# Patient Record
Sex: Male | Born: 1975 | Race: White | Hispanic: No | Marital: Married | Smoking: Never smoker
Health system: Southern US, Community
[De-identification: ages and names within clinical notes are randomized; demographics above are authoritative.]

## PROBLEM LIST (undated history)

## (undated) DIAGNOSIS — J309 Allergic rhinitis, unspecified: Secondary | ICD-10-CM

## (undated) DIAGNOSIS — Z789 Other specified health status: Secondary | ICD-10-CM

## (undated) DIAGNOSIS — T4145XA Adverse effect of unspecified anesthetic, initial encounter: Secondary | ICD-10-CM

## (undated) DIAGNOSIS — T8859XA Other complications of anesthesia, initial encounter: Secondary | ICD-10-CM

## (undated) HISTORY — DX: Allergic rhinitis, unspecified: J30.9

## (undated) HISTORY — PX: HERNIA REPAIR: SHX51

## (undated) HISTORY — PX: OTHER SURGICAL HISTORY: SHX169

---

## 2008-03-28 ENCOUNTER — Ambulatory Visit: Payer: Self-pay | Admitting: Internal Medicine

## 2008-03-28 LAB — CONVERTED CEMR LAB
ALT: 19 units/L (ref 0–53)
AST: 22 units/L (ref 0–37)
Albumin: 4.5 g/dL (ref 3.5–5.2)
BUN: 7 mg/dL (ref 6–23)
Bacteria, UA: NEGATIVE
Basophils Absolute: 0.1 10*3/uL (ref 0.0–0.1)
Basophils Relative: 0.9 % (ref 0.0–3.0)
Bilirubin Urine: NEGATIVE
CO2: 32 meq/L (ref 19–32)
Chloride: 104 meq/L (ref 96–112)
Cholesterol: 121 mg/dL (ref 0–200)
Creatinine, Ser: 0.9 mg/dL (ref 0.4–1.5)
Glucose, Bld: 90 mg/dL (ref 70–99)
HCT: 44.1 % (ref 39.0–52.0)
Hemoglobin: 15.4 g/dL (ref 13.0–17.0)
Ketones, ur: NEGATIVE mg/dL
LDL Cholesterol: 64 mg/dL (ref 0–99)
Leukocytes, UA: NEGATIVE
Lymphocytes Relative: 34.1 % (ref 12.0–46.0)
MCHC: 34.8 g/dL (ref 30.0–36.0)
Monocytes Absolute: 0.5 10*3/uL (ref 0.1–1.0)
Mucus, UA: NEGATIVE
Neutro Abs: 2.8 10*3/uL (ref 1.4–7.7)
RBC: 4.93 M/uL (ref 4.22–5.81)
Squamous Epithelial / LPF: NEGATIVE /lpf
TSH: 2.09 microintl units/mL (ref 0.35–5.50)
Total Protein, Urine: NEGATIVE mg/dL
Total Protein: 7.4 g/dL (ref 6.0–8.3)
pH: 6 (ref 5.0–8.0)

## 2008-03-29 ENCOUNTER — Encounter: Payer: Self-pay | Admitting: Internal Medicine

## 2008-12-05 ENCOUNTER — Ambulatory Visit: Payer: Self-pay | Admitting: Internal Medicine

## 2008-12-05 DIAGNOSIS — J3089 Other allergic rhinitis: Secondary | ICD-10-CM

## 2008-12-05 LAB — CONVERTED CEMR LAB
Basophils Absolute: 0 10*3/uL (ref 0.0–0.1)
HCT: 43.1 % (ref 39.0–52.0)
Lymphocytes Relative: 31.9 % (ref 12.0–46.0)
Lymphs Abs: 1.6 10*3/uL (ref 0.7–4.0)
Monocytes Relative: 11.4 % (ref 3.0–12.0)
Platelets: 146 10*3/uL — ABNORMAL LOW (ref 150.0–400.0)
RDW: 12.1 % (ref 11.5–14.6)

## 2009-04-25 ENCOUNTER — Ambulatory Visit: Payer: Self-pay | Admitting: Internal Medicine

## 2009-06-01 ENCOUNTER — Ambulatory Visit: Payer: Self-pay | Admitting: Internal Medicine

## 2009-06-01 DIAGNOSIS — J309 Allergic rhinitis, unspecified: Secondary | ICD-10-CM | POA: Insufficient documentation

## 2009-06-01 HISTORY — DX: Allergic rhinitis, unspecified: J30.9

## 2009-09-28 ENCOUNTER — Ambulatory Visit: Payer: Self-pay | Admitting: Internal Medicine

## 2010-05-07 ENCOUNTER — Telehealth: Payer: Self-pay | Admitting: Internal Medicine

## 2010-06-04 NOTE — Assessment & Plan Note (Signed)
Summary: 4 MO ROV /NWS #     Vital Signs:  Patient profile:   35 year old male Height:      71 inches Weight:      157 pounds BMI:     21.98 O2 Sat:      99 % on Room air Temp:     98.0 degrees F oral Pulse rate:   65 / minute Pulse rhythm:   regular Resp:     16 per minute BP sitting:   120 / 70  (left arm) Cuff size:   large  Vitals Entered By: Rock Nephew CMA (Sep 28, 2009 3:48 PM)  O2 Flow:  Room air  Primary Care Provider:  Etta Grandchild MD   History of Present Illness:  Follow-Up Visit      This is a 35 year old man who presents for Follow-up visit.  The patient denies chest pain, palpitations, dizziness, syncope, and SOB.  Since the last visit the patient notes no new problems or concerns.  The patient reports taking meds as prescribed.  When questioned about possible medication side effects, the patient notes none.    Preventive Screening-Counseling & Management  Alcohol-Tobacco     Alcohol drinks/day: 0     Smoking Status: never     Passive Smoke Exposure: no  Hep-HIV-STD-Contraception     Hepatitis Risk: no risk noted     HIV Risk: no     STD Risk: no risk noted      Sexual History:  currently monogamous.        Drug Use:  no.        Blood Transfusions:  no.    Clinical Review Panels:  Lipid Management   Cholesterol:  121 (03/28/2008)   LDL (bad choesterol):  64 (03/28/2008)   HDL (good cholesterol):  50.1 (03/28/2008)  Diabetes Management   Creatinine:  0.9 (03/28/2008)   Last Flu Vaccine:  Fluvax 3+ (03/28/2008)  CBC   WBC:  5.1 (12/05/2008)   RBC:  4.86 (12/05/2008)   Hgb:  14.6 (12/05/2008)   Hct:  43.1 (12/05/2008)   Platelets:  146.0 (12/05/2008)   MCV  88.8 (12/05/2008)   MCHC  33.9 (12/05/2008)   RDW  12.1 (12/05/2008)   PMN:  49.9 (12/05/2008)   Lymphs:  31.9 (12/05/2008)   Monos:  11.4 (12/05/2008)   Eosinophils:  6.3 (12/05/2008)   Basophil:  0.5 (12/05/2008)  Complete Metabolic Panel   Glucose:  90 (03/28/2008)  Sodium:  141 (03/28/2008)   Potassium:  3.9 (03/28/2008)   Chloride:  104 (03/28/2008)   CO2:  32 (03/28/2008)   BUN:  7 (03/28/2008)   Creatinine:  0.9 (03/28/2008)   Albumin:  4.5 (03/28/2008)   Total Protein:  7.4 (03/28/2008)   Calcium:  9.6 (03/28/2008)   Total Bili:  0.7 (03/28/2008)   Alk Phos:  62 (03/28/2008)   SGPT (ALT):  19 (03/28/2008)   SGOT (AST):  22 (03/28/2008)   Medications Prior to Update: 1)  Nasacort Aq 55 Mcg/act Aers (Triamcinolone Acetonide(Nasal)) .... 2 Puffs Each Nostril Qd 2)  Singulair 10 Mg Tabs (Montelukast Sodium) .... One By Mouth Once Daily  Current Medications (verified): 1)  Nasacort Aq 55 Mcg/act Aers (Triamcinolone Acetonide(Nasal)) .... 2 Puffs Each Nostril Qd 2)  Singulair 10 Mg Tabs (Montelukast Sodium) .... One By Mouth Once Daily 3)  Allegra 180 Mg Tabs (Fexofenadine Hcl) .... One By Mouth Once Daily As Needed For Nasal Allergies  Allergies (verified): No Known Drug  Allergies  Past History:  Past Medical History: Reviewed history from 04/25/2009 and no changes required. Allergic rhinitis  Past Surgical History: Reviewed history from 03/28/2008 and no changes required. Denies surgical history  Family History: Reviewed history from 03/28/2008 and no changes required. Family History of Arthritis Family History High cholesterol Family History Hypertension  Social History: Reviewed history from 04/25/2009 and no changes required. 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   ENT:  Complains of postnasal drainage; denies decreased hearing, difficulty swallowing, ear discharge, earache, nasal congestion,  nosebleeds, ringing in ears, sinus pressure, and sore throat.  Physical Exam  General:  alert, well-developed, well-nourished, well-hydrated, appropriate dress, normal appearance, healthy-appearing, and cooperative to examination.   Ears:  R ear normal and L ear normal.   Nose:  mucosal pallor and mild swelling.   Mouth:  good dentition, no exudates, posterior lymphoid hypertrophy, and postnasal drip.   Neck:  supple, full ROM, no masses, no carotid bruits, no cervical lymphadenopathy, and no neck tenderness.   Lungs:  Normal respiratory effort, chest expands symmetrically. Lungs are clear to auscultation, no crackles or wheezes. Heart:  Normal rate and regular rhythm. S1 and S2 normal without gallop, murmur, click, rub or other extra sounds. Abdomen:  Bowel sounds positive,abdomen soft and non-tender without masses, organomegaly or hernias noted. Msk:  normal ROM, no joint tenderness, no joint swelling, no joint warmth, no redness over joints, no joint deformities, and no joint instability.   Pulses:  R and L carotid,radial,femoral,dorsalis pedis and posterior tibial pulses are full and equal bilaterally Extremities:  No clubbing, cyanosis, edema, or deformity noted with normal full range of motion of all joints.   Neurologic:  No cranial nerve deficits noted. Station and gait are normal. Plantar reflexes are down-going bilaterally. DTRs are symmetrical throughout. Sensory, motor and coordinative functions appear intact. Skin:  Intact without suspicious lesions or rashes Cervical Nodes:  no anterior cervical adenopathy and no posterior cervical adenopathy.   Axillary Nodes:  no R axillary adenopathy and no L axillary adenopathy.   Psych:  Cognition and judgment appear intact. Alert and cooperative with normal attention span and concentration. No apparent delusions, illusions, hallucinations   Impression & Recommendations:  Problem # 1:  ALLERGIC RHINITIS CAUSE UNSPECIFIED  (ICD-477.9) Assessment Unchanged  His updated medication list for this problem includes:    Nasacort Aq 55 Mcg/act Aers (Triamcinolone acetonide(nasal)) .Marland Kitchen... 2 puffs each nostril qd    Allegra 180 Mg Tabs (Fexofenadine hcl) ..... One by mouth once daily as needed for nasal allergies  Discussed use of allergy medications and environmental measures.   Complete Medication List: 1)  Nasacort Aq 55 Mcg/act Aers (Triamcinolone acetonide(nasal)) .... 2 puffs each nostril qd 2)  Singulair 10 Mg Tabs (Montelukast sodium) .... One by mouth once daily 3)  Allegra 180 Mg Tabs (Fexofenadine hcl) .... One by mouth once daily as needed for nasal allergies  Patient Instructions: 1)  Please schedule a follow-up appointment as needed. Prescriptions: ALLEGRA 180 MG TABS (FEXOFENADINE HCL) One by mouth once daily as needed for nasal allergies  #20 x 0   Entered and Authorized by:   Etta Grandchild MD   Signed by:   Etta Grandchild MD on  09/28/2009   Method used:   Telephoned to ...         RxID:   [id number]

## 2010-06-04 NOTE — Assessment & Plan Note (Signed)
Summary: 1 MO ROV /NWS   Vital Signs:  Patient profile:   35 year old male Height:      71 inches Weight:      158 pounds BMI:     22.12 O2 Sat:      99 % on Room air Temp:     98.0 degrees F oral Pulse rate:   70 / minute Pulse rhythm:   regular Resp:     16 per minute BP sitting:   112 / 72  (left arm) Cuff size:   large  Vitals Entered By: Rock Nephew CMA (June 01, 2009 3:43 PM)  O2 Flow:  Room air  Primary Care Provider:  Etta Grandchild MD   History of Present Illness: He returns for a f/up on nasal allergies. He thinks that the Nasacort helps but he did not get much benefit from Clarinex.  Preventive Screening-Counseling & Management  Alcohol-Tobacco     Alcohol drinks/day: 0     Smoking Status: never     Passive Smoke Exposure: no  Hep-HIV-STD-Contraception     HIV Risk: no  Medications Prior to Update: 1)  Nasacort Aq 55 Mcg/act Aers (Triamcinolone Acetonide(Nasal)) .... 2 Puffs Each Nostril Qd 2)  Clarinex 5 Mg Tabs (Desloratadine) .... Once Daily For Allergies  Current Medications (verified): 1)  Nasacort Aq 55 Mcg/act Aers (Triamcinolone Acetonide(Nasal)) .... 2 Puffs Each Nostril Qd 2)  Clarinex 5 Mg Tabs (Desloratadine) .... Once Daily For Allergies  Allergies (verified): No Known Drug Allergies  Past History:  Past Medical History: Reviewed history from 04/25/2009 and no changes required. Allergic rhinitis  Past Surgical History: Reviewed history from 03/28/2008 and no changes required. Denies surgical history  Family History: Reviewed history from 03/28/2008 and no changes required. Family History of Arthritis Family History High cholesterol Family History Hypertension  Social History: Reviewed history from 04/25/2009 and no changes required. 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   ENT:  Complains of nasal congestion and postnasal drainage; denies decreased hearing, difficulty swallowing, ear discharge, earache, hoarseness, ringing in ears, sinus pressure, and sore throat.  Physical Exam  General:  alert, well-developed, well-nourished, well-hydrated, appropriate dress, normal appearance, healthy-appearing, and cooperative to examination.   Nose:  mucosal pallor and mild swelling.   Mouth:  good dentition, no exudates, posterior lymphoid hypertrophy, and postnasal drip.   Neck:  supple, full ROM, no masses, no carotid bruits, no cervical lymphadenopathy, and no neck tenderness.   Lungs:  Normal respiratory effort, chest expands symmetrically. Lungs are clear to auscultation, no crackles or wheezes. Heart:  Normal rate and regular rhythm. S1 and S2 normal without gallop, murmur, click, rub or other extra sounds. Abdomen:  Bowel sounds positive,abdomen soft and non-tender without masses, organomegaly or hernias noted. Skin:  Intact without suspicious lesions or rashes Cervical Nodes:  no anterior cervical adenopathy and no posterior cervical adenopathy.   Axillary Nodes:  no R axillary adenopathy and no L axillary adenopathy.   Inguinal Nodes:  no R inguinal adenopathy and no L inguinal adenopathy.   Psych:  Cognition and judgment appear intact. Alert and cooperative with normal attention span and concentration. No apparent delusions, illusions, hallucinations   Impression & Recommendations:  Problem # 1:  ALLERGIC RHINITIS CAUSE UNSPECIFIED (ICD-477.9) Assessment Unchanged try singulair The following medications were removed from  the medication list:    Clarinex 5 Mg Tabs (Desloratadine) ..... Once daily for allergies His updated medication list for this problem includes:    Nasacort Aq 55 Mcg/act Aers (Triamcinolone acetonide(nasal)) .Marland Kitchen... 2 puffs each nostril qd  Complete Medication List: 1)   Nasacort Aq 55 Mcg/act Aers (Triamcinolone acetonide(nasal)) .... 2 puffs each nostril qd 2)  Singulair 10 Mg Tabs (Montelukast sodium) .... One by mouth once daily  Patient Instructions: 1)  Please schedule a follow-up appointment in 4 months. Prescriptions: SINGULAIR 10 MG TABS (MONTELUKAST SODIUM) One by mouth once daily  #112 x 0   Entered and Authorized by:   Etta Grandchild MD   Signed by:   Etta Grandchild MD on 06/01/2009   Method used:   Samples Given   RxID:   [id number]

## 2010-06-06 NOTE — Progress Notes (Signed)
  Phone Note Refill Request Message from:  Fax from Pharmacy on May 07, 2010 9:15 AM  Refills Requested: Medication #1:  NASACORT AQ 55 MCG/ACT AERS 2 puffs each nostril qd   Last Refilled: 02/28/2010 Initial call taken by: Rock Nephew CMA,  May 07, 2010 9:15 AM    Prescriptions: NASACORT AQ 55 MCG/ACT AERS (TRIAMCINOLONE ACETONIDE(NASAL)) 2 puffs each nostril qd  #1 inhaler x 6   Entered by:   Rock Nephew CMA   Authorized by:   Etta Grandchild MD   Signed by:   Rock Nephew CMA on 05/07/2010   Method used:   Faxed to ...       [facility] (retail)       [phone]       [place], [place]  [address]       Ph: [phone]       Fax: [phone]   RxID:   [id number]

## 2010-11-07 ENCOUNTER — Other Ambulatory Visit (INDEPENDENT_AMBULATORY_CARE_PROVIDER_SITE_OTHER): Payer: Managed Care, Other (non HMO)

## 2010-11-07 ENCOUNTER — Ambulatory Visit (INDEPENDENT_AMBULATORY_CARE_PROVIDER_SITE_OTHER): Payer: Managed Care, Other (non HMO) | Admitting: Internal Medicine

## 2010-11-07 ENCOUNTER — Encounter: Payer: Self-pay | Admitting: Internal Medicine

## 2010-11-07 VITALS — BP 110/68 | HR 103 | Temp 100.5°F | Ht 71.0 in | Wt 152.4 lb

## 2010-11-07 DIAGNOSIS — R300 Dysuria: Secondary | ICD-10-CM

## 2010-11-07 LAB — URINALYSIS, ROUTINE W REFLEX MICROSCOPIC
Bilirubin Urine: NEGATIVE
Nitrite: POSITIVE
pH: 6 (ref 5.0–8.0)

## 2010-11-07 MED ORDER — CEPHALEXIN 500 MG PO CAPS: 500.0000 mg | ORAL_CAPSULE | Freq: Four times a day (QID) | ORAL | Status: DC

## 2010-11-07 NOTE — Assessment & Plan Note (Signed)
prob UTI by hx, but without specific risk factors;  No prior hx of such;  Mild to mod, for antibx course,  to f/u any worsening symptoms or concerns, for urine studies today

## 2010-11-07 NOTE — Patient Instructions (Signed)
Take all new medications as prescribed Please go to LAB in the Basement for the blood and/or urine tests to be done today Please call the phone number [phone] (the [employer]Tree System) for results of testing in 2-3 days;  When calling, simply dial the number, and when prompted enter the MRN number above (the Medical Record Number) and the # key, then the message should start.

## 2010-11-07 NOTE — Progress Notes (Signed)
  Subjective:    Patient ID: Derek [name], male    DOB: [birth year], 35 y.o.   MRN: [id number]  HPI  Here with 2 days onset fever up to 101, and dysuria, but without  frequency, urgency,or hematuria, abd pain, n/v, chills, back or flank pain.  No prior hx of UTI, no other symptoms to suggest other source such as HA, ST, rash, CP, SOB.  Incidentally wife jsut recently tx in ER for UTI as well.  Pt denies chest pain, increased sob or doe, wheezing, orthopnea, PND, increased LE swelling, palpitations, dizziness or syncope.  Pt denies new neurological symptoms such as new headache, or facial or extremity weakness or numbness   Pt denies polydipsia, polyuria.  No hx of DM , malignancy, other immunesuppressing illness or specific GU problem such as ureteral stricture.  Denies penile d/c , or HIV risk. Past Medical History  Diagnosis Date  . ALLERGIC RHINITIS CAUSE UNSPECIFIED 06/01/2009   No past surgical history on file.  reports that he has never smoked. He does not have any smokeless tobacco history on file. He reports that he does not drink alcohol or use illicit drugs. family history includes Arthritis in his other; Hyperlipidemia in his other; and Hypertension in his other. No Known Allergies No current outpatient prescriptions on file prior to visit.   Review of Systems Review of Systems  Constitutional: Negative for diaphoresis and unexpected weight change.  HENT: Negative for drooling and tinnitus.   Eyes: Negative for photophobia and visual disturbance.  Respiratory: Negative for choking and stridor.   Gastrointestinal: Negative for vomiting and blood in stool.  Genitourinary: Negative for hematuria and decreased urine volume.     Objective:   Physical Exam BP 110/68  Pulse 103  Temp(Src) 100.5 F (38.1 C) (Oral)  Ht 5\' 11"  (1.803 m)  Wt 152 lb 6 oz (69.117 kg)  BMI 21.25 kg/m2  SpO2 99% Physical Exam  VS noted, fatigued but nontoxic Constitutional: Pt appears well-developed and  well-nourished.  HENT: Head: Normocephalic.  Right Ear: External ear normal.  Left Ear: External ear normal. Bilat tm's clear, canals normal, pharynx bening Eyes: Conjunctivae and EOM are normal. Pupils are equal, round, and reactive to light.  Neck: Normal range of motion. Neck supple.  Cardiovascular: Normal rate and regular rhythm.   Pulmonary/Chest: Effort normal and breath sounds normal.  Abd:  Soft, NT, non-distended, + BS, no splenomegaly, no flank tender Neurological: Pt is alert. No cranial nerve deficit.  Skin: Skin is warm. No erythema.  Psychiatric: Pt behavior is normal. Thought content normal.         Assessment & Plan:

## 2010-11-10 ENCOUNTER — Other Ambulatory Visit: Payer: Self-pay | Admitting: Internal Medicine

## 2010-11-10 MED ORDER — CIPROFLOXACIN HCL 500 MG PO TABS: 500.0000 mg | ORAL_TABLET | Freq: Two times a day (BID) | ORAL | Status: AC

## 2010-11-22 ENCOUNTER — Telehealth: Payer: Self-pay | Admitting: *Deleted

## 2010-11-22 NOTE — Telephone Encounter (Signed)
Pt left vm at 4:32pm stating has completed recent abx for E-Coli and since completing it he has been experiencing burning during urination. He is asking whether he should start a diff rx. Please advise

## 2010-11-23 MED ORDER — CIPROFLOXACIN HCL 500 MG PO TABS: 500.0000 mg | ORAL_TABLET | Freq: Two times a day (BID) | ORAL | Status: AC

## 2010-11-25 ENCOUNTER — Telehealth: Payer: Self-pay

## 2010-11-25 NOTE — Telephone Encounter (Signed)
[facility] Triage Call Report Triage Record Num: [id number] Operator: Amy Head Patient Name: Derek [name] Call Date & Time: 11/23/2010 9:28:32AM Patient Phone: [phone] PCP: Patient Gender: Male PCP Fax : Patient DOB: 1975/05/07 Practice Name: [facility] Reason for Call: Pt/[place] calling for Urinary frequency and burning. Upon callback, pt states that he has just spoken with Dr. Yetta Barre and does not need any triage, however does need an appt for next week per Dr. Yetta Barre instructions. Spoke with Rosa with [facility] and states that pt needs to call office on Monday, 11/25/10 for appt. Pt agreed. Protocol(s) Used: Office Note Recommended Outcome per Protocol: Information Noted and Sent to Office Reason for Outcome: Caller information to office Care Advice: ~ 11/23/2010 9:33:18AM Page 1 of 1 [email or web address]

## 2010-11-27 ENCOUNTER — Encounter: Payer: Self-pay | Admitting: Internal Medicine

## 2010-11-27 ENCOUNTER — Ambulatory Visit (INDEPENDENT_AMBULATORY_CARE_PROVIDER_SITE_OTHER): Payer: Managed Care, Other (non HMO) | Admitting: Internal Medicine

## 2010-11-27 VITALS — BP 112/64 | HR 63 | Temp 97.3°F | Resp 16 | Wt 152.0 lb

## 2010-11-27 DIAGNOSIS — N410 Acute prostatitis: Secondary | ICD-10-CM

## 2010-11-27 NOTE — Assessment & Plan Note (Signed)
With the size and consistency of his prostate exam and the + urine culture I think he will need at least 4 weeks of treatment with cipro, he was given pt ed material about prostatitis

## 2010-11-27 NOTE — Patient Instructions (Signed)
Prostatitis Prostatitis is an inflammation (the body's way of reacting to injury and/or infection) of the prostate gland. The prostate gland is a male organ. The gland is about the size and shape of a walnut. The prostate is located just below the bladder. It produces semen, which is a fluid that helps nourish and transport sperm. Prostatitis is the most common urinary tract problem in men younger than age 34. There are 4 categories of prostatitis:  I - Acute bacterial prostatitis.   II - Chronic bacterial prostatitis.   III - Chronic prostatitis and chronic pelvic pain syndrome (CPPS).   Inflammatory.   Non inflammatory.   IV - Asymptomatic inflammatory prostatitis.  Acute and chronic bacterial prostatitis are problems with bacterial infections of the prostate. "Acute" infection is usually a one-time problem. "Chronic" bacterial prostatitis is a condition with recurrent infection. It is usually caused by the same germ (bacteria). CPPS has symptoms similar to prostate infection. However, no infection is actually found. This condition can cause problems of ongoing pain. Currently, it cannot be cured. Treatments are available and aimed at symptom control.  Asymptomatic inflammatory prostatitis has no symptoms. It is a condition where infection-fighting cells are found by chance in the urine. The diagnosis is made most often during an exam for other conditions. Other conditions could be infertility or a high level of PSA (prostate-specific antigen) in the blood. SYMPTOMS Symptoms can vary depending upon the type of prostatitis that exists. There can also be overlap in symptoms. This can make diagnosis difficult. Symptoms: For Acute bacterial prostatitis  Painful urination.  Fever and/or chills.   Muscle and/or joint pains.   Low back pain.   Low abdominal pain.   Inability to empty bladder completely.   Sudden urges to urinate.  Frequent urination during the day.   Difficulty  starting urine stream.   Need to urinate several times at night (nocturia).   Weak urine stream.   Urethral (tube that carries urine from the bladder out of the body) discharge and dribbling after urination.   For Chronic bacterial prostatitis  Rectal pain.   Pain in the testicles, penis, or tip of the penis.   Pain in the space between the anus and scrotum (perineum).   Low back pain.   Low abdominal pain.   Problems with sexual function.   Painful ejaculation.   Bloody semen.   Inability to empty bladder completely.   Painful urination.   Sudden urges to urinate.   Frequent urination during the day.   Difficulty starting urine stream.   Need to urinate several times at night (nocturia).   Weak urine stream.   Dribbling after urination.   Urethral discharge.   For Chronic prostatitis and chronic pelvic pain syndrome (CPPS) Symptoms are the same as those for chronic bacterial prostatitis. Problems with sexual function are often the reason for seeking care. This important problem should be discussed with your caregiver. For Asymptomatic inflammatory prostatitis As noted above, there are no symptoms with this condition. DIAGNOSIS  Your caregiver may perform a rectal exam. This exam is to determine if the prostate is swollen and tender.   Sometimes blood work is performed. This is done to see if your white blood cell count is elevated. The Prostate Specific Antigen (PSA) is also measured. PSA is a blood test that can help detect early prostate cancer.   A urinalysis is done to find out what type of infection is present if this is a suspected cause. An additional   urinalysis may be done after a digital rectal exam. This is to see if white blood cells are pushed out of the prostate and into the urine. A low-grade infection of the prostate may not be found on the first urinalysis.  In more difficult cases, your caregiver may advise other tests. Tests could  include:  Urodynamics -- Tests the function of the bladder and the organs involved in triggering and controlling normal urination.   Urine flow rate.   Cystoscopy -- In this procedure, a thin, telescope-like tube with a light and tiny camera attached (cystoscope) is inserted into the bladder through the urethra. This allows the caregiver to see the inside of the urethra and bladder.   Electromyography -- This procedure tests how the muscles and nerves of the bladder work. It is focused on the muscles that control the anus and pelvic floor. These are the muscles between the anus and scrotum.  In people who show no signs of infection, certain uncommon infections might be causing constant or recurrent symptoms. These uncommon infections are difficult to detect. More work in medicine may help find solutions to these problems. TREATMENT Antibiotics are used to treat infections caused by germs. If the infection is not treated and becomes long lasting (chronic), it may become a lower grade infection with minor, continual problems. Without treatment, the prostate may develop a boil or furuncle (abscess). This may require surgical treatment. For those with chronic prostatitis and CPPS, it is important to work closely with your primary caregiver and urologist. For some, the medicines that are used to treat a non-cancerous, enlarged prostate (benign prostatic hypertrophy) may be helpful. Referrals to specialists other than urologists may be necessary. In rare cases when all treatments have been inadequate for pain control, an operation to remove the prostate may be recommended. This is very rare and before this is considered thorough discussion with your urologist is highly recommended.  In cases of secondary to chronic non-bacterial prostatitis, a good relationship with your urologist or primary caregiver is essential because it is often a recurrent prolonged condition that requires a good understanding of the  causes and a commitment to therapy aimed at controlling your symptoms. HOME CARE INSTRUCTIONS  Hot sitz baths for 20 minutes, 4 times per day, may help relieve pain.   Non-prescription pain killers may be used as your caregiver recommends if you have no allergies to them. Some illnesses or conditions prevent use of non-prescription drugs. If unsure, check with your caregiver. Take all medications as directed. Take the antibiotics for the prescribed length of time, even if you are feeling better.  SEEK MEDICAL CARE IF:  You have any worsening of the symptoms that originally brought you to your caregiver.   You have an oral temperature above 100.5.   You experience any side effects from medications prescribed.  SEEK IMMEDIATE MEDICAL CARE IF:  You have an oral temperature above 100.5, not controlled by medicine.   You have pain not relieved with medications.   You develop nausea, vomiting, lightheadedness, or have a fainting episode.   You are unable to urinate.   You pass bloody urine or clots.  Document Released: 04/18/2000 Document Re-Released: 07/16/2009 [employer] Patient Information 2011 [employer]. 

## 2010-11-27 NOTE — Progress Notes (Signed)
Subjective:    Patient ID: Derek [name], male    DOB: 12/03/1975, 35 y.o.   MRN: [id number]  Urinary Tract Infection  This is a recurrent problem. The current episode started 1 to 4 weeks ago. The problem occurs every urination. The problem has been unchanged. The quality of the pain is described as burning. The pain is at a severity of 1/10. The pain is mild. There has been no fever. He is sexually active. There is no history of pyelonephritis. Associated symptoms include frequency and hesitancy. Pertinent negatives include no chills, discharge, flank pain, hematuria, nausea, possible pregnancy, sweats, urgency or vomiting. He has tried antibiotics for the symptoms. The treatment provided moderate relief. There is no history of recurrent UTIs.  He was seen about three weeks ago and he was initially started on Keflex and did not improve, a culture was done and it was + for E. Coli so the antibiotic was changed to Cipro, he was only given a 2 week supply, he started to feel better on cipro but after he stopped it the symptoms returned and he restarted the kelfex but he had worsening dysuria and severe frequency.    Review of Systems  Constitutional: Negative.  Negative for chills.  HENT: Negative.   Eyes: Negative.   Respiratory: Negative.   Cardiovascular: Negative.   Gastrointestinal: Negative.  Negative for nausea and vomiting.  Genitourinary: Positive for dysuria, hesitancy, frequency and penile pain. Negative for urgency, hematuria, flank pain, decreased urine volume, discharge, penile swelling, scrotal swelling, enuresis, difficulty urinating, genital sores and testicular pain.  Musculoskeletal: Negative.   Neurological: Negative.   Hematological: Negative.   Psychiatric/Behavioral: Negative.        Objective:   Physical Exam  Vitals reviewed. Constitutional: He appears well-developed and well-nourished. No distress.  HENT:  Head: Normocephalic and atraumatic.  Right Ear:  External ear normal.  Left Ear: External ear normal.  Nose: Nose normal.  Mouth/Throat: Oropharynx is clear and moist. No oropharyngeal exudate.  Eyes: Conjunctivae and EOM are normal. Pupils are equal, round, and reactive to light. Right eye exhibits no discharge. Left eye exhibits no discharge. No scleral icterus.  Neck: Normal range of motion. Neck supple. No JVD present. No tracheal deviation present. No thyromegaly present.  Cardiovascular: Normal rate, regular rhythm, normal heart sounds and intact distal pulses.  Exam reveals no gallop and no friction rub.   No murmur heard. Pulmonary/Chest: Effort normal and breath sounds normal. No stridor. No respiratory distress. He has no wheezes. He has no rales. He exhibits no tenderness.  Abdominal: Soft. Bowel sounds are normal. He exhibits no distension and no mass. There is no tenderness. There is no rebound and no guarding. Hernia confirmed negative in the right inguinal area and confirmed negative in the left inguinal area.  Genitourinary: Rectum normal, testes normal and penis normal. Rectal exam shows no external hemorrhoid, no internal hemorrhoid, no fissure, no mass, no tenderness and anal tone normal. Guaiac negative stool. Prostate is enlarged (he has 2+ symmetrical hypertrophy with a boggy prostate) and tender. Right testis shows no mass, no swelling and no tenderness. Right testis is descended. Cremasteric reflex is not absent on the right side. Left testis shows no mass, no swelling and no tenderness. Left testis is descended. Cremasteric reflex is not absent on the left side. Circumcised. No penile erythema or penile tenderness. No discharge found.  Lymphadenopathy:    He has no cervical adenopathy.       Right: No inguinal adenopathy  present.       Left: No inguinal adenopathy present.  Skin: He is not diaphoretic.          Assessment & Plan:

## 2010-12-09 NOTE — Telephone Encounter (Signed)
Pt had OV on 11-27-10. Closing phone note.

## 2011-04-10 ENCOUNTER — Ambulatory Visit (INDEPENDENT_AMBULATORY_CARE_PROVIDER_SITE_OTHER): Payer: Managed Care, Other (non HMO) | Admitting: Internal Medicine

## 2011-04-10 ENCOUNTER — Encounter: Payer: Self-pay | Admitting: Internal Medicine

## 2011-04-10 VITALS — BP 120/72 | HR 66 | Temp 98.4°F | Resp 16 | Wt 152.0 lb

## 2011-04-10 DIAGNOSIS — Z23 Encounter for immunization: Secondary | ICD-10-CM

## 2011-04-10 DIAGNOSIS — J309 Allergic rhinitis, unspecified: Secondary | ICD-10-CM

## 2011-04-10 DIAGNOSIS — J028 Acute pharyngitis due to other specified organisms: Secondary | ICD-10-CM | POA: Insufficient documentation

## 2011-04-10 DIAGNOSIS — J029 Acute pharyngitis, unspecified: Secondary | ICD-10-CM

## 2011-04-10 MED ORDER — AMOXICILLIN 500 MG PO CAPS: 500.0000 mg | ORAL_CAPSULE | Freq: Three times a day (TID) | ORAL | Status: AC

## 2011-04-10 NOTE — Assessment & Plan Note (Signed)
Start amoxil 

## 2011-04-10 NOTE — Progress Notes (Signed)
  Subjective:    Patient ID: Derek [name], male    DOB: March 13, 1976, 35 y.o.   MRN: [id number]  Sore Throat  This is a new problem. The current episode started yesterday. The problem has been gradually worsening. Neither side of throat is experiencing more pain than the other. There has been no fever. The pain is at a severity of 1/10. The pain is mild. Pertinent negatives include no abdominal pain, congestion, coughing, diarrhea, drooling, ear discharge, ear pain, headaches, hoarse voice, plugged ear sensation, neck pain, shortness of breath, stridor, swollen glands, trouble swallowing or vomiting. He has had exposure to strep. He has tried nothing for the symptoms.      Review of Systems  Constitutional: Negative for fever, chills, diaphoresis, activity change, appetite change, fatigue and unexpected weight change.  HENT: Positive for sore throat. Negative for ear pain, nosebleeds, congestion, hoarse voice, drooling, mouth sores, trouble swallowing, neck pain, voice change and ear discharge.   Eyes: Negative.   Respiratory: Negative for cough, shortness of breath and stridor.   Cardiovascular: Negative for chest pain, palpitations and leg swelling.  Gastrointestinal: Negative for nausea, vomiting, abdominal pain, diarrhea, constipation, abdominal distention and anal bleeding.  Genitourinary: Negative for dysuria, urgency, frequency, hematuria, flank pain, decreased urine volume, enuresis and difficulty urinating.  Musculoskeletal: Negative for myalgias, back pain, joint swelling, arthralgias and gait problem.  Skin: Negative for color change, pallor, rash and wound.  Neurological: Negative for dizziness, tremors, seizures, syncope, facial asymmetry, speech difficulty, weakness, light-headedness, numbness and headaches.  Hematological: Negative for adenopathy. Does not bruise/bleed easily.  Psychiatric/Behavioral: Negative.        Objective:   Physical Exam  Vitals  reviewed. Constitutional: He is oriented to person, place, and time. He appears well-developed and well-nourished. No distress.  HENT:  Head: Normocephalic and atraumatic.  Right Ear: Hearing, tympanic membrane, external ear and ear canal normal.  Left Ear: Hearing, tympanic membrane, external ear and ear canal normal.  Mouth/Throat: Mucous membranes are normal. Mucous membranes are not pale, not dry and not cyanotic. Posterior oropharyngeal erythema present. No oropharyngeal exudate, posterior oropharyngeal edema or tonsillar abscesses.  Eyes: Conjunctivae are normal. Right eye exhibits no discharge. Left eye exhibits no discharge. No scleral icterus.  Neck: Normal range of motion. Neck supple. No JVD present. No tracheal deviation present. No thyromegaly present.  Cardiovascular: Normal rate, regular rhythm, normal heart sounds and intact distal pulses.  Exam reveals no gallop and no friction rub.   No murmur heard. Pulmonary/Chest: Effort normal and breath sounds normal. No stridor. No respiratory distress. He has no wheezes. He has no rales. He exhibits no tenderness.  Abdominal: Soft. Bowel sounds are normal. He exhibits no distension. There is no tenderness. There is no rebound and no guarding.  Musculoskeletal: Normal range of motion. He exhibits no edema and no tenderness.  Lymphadenopathy:    He has no cervical adenopathy.  Neurological: He is oriented to person, place, and time.  Skin: Skin is warm and dry. No rash noted. He is not diaphoretic. No erythema. No pallor.  Psychiatric: He has a normal mood and affect. His behavior is normal. Judgment and thought content normal.          Assessment & Plan:

## 2011-04-10 NOTE — Patient Instructions (Signed)

## 2011-04-10 NOTE — Assessment & Plan Note (Signed)
This has improved.

## 2011-06-25 ENCOUNTER — Other Ambulatory Visit: Payer: Self-pay | Admitting: Internal Medicine

## 2011-09-09 ENCOUNTER — Encounter: Payer: Self-pay | Admitting: Endocrinology

## 2011-09-09 ENCOUNTER — Ambulatory Visit (INDEPENDENT_AMBULATORY_CARE_PROVIDER_SITE_OTHER): Payer: Managed Care, Other (non HMO) | Admitting: Endocrinology

## 2011-09-09 ENCOUNTER — Ambulatory Visit (INDEPENDENT_AMBULATORY_CARE_PROVIDER_SITE_OTHER)
Admission: RE | Admit: 2011-09-09 | Discharge: 2011-09-09 | Disposition: A | Discharge status: Home / Self Care | Payer: Managed Care, Other (non HMO) | Source: Ambulatory Visit | Attending: Endocrinology | Admitting: Endocrinology

## 2011-09-09 VITALS — BP 122/72 | HR 61 | Temp 98.6°F | Ht 71.0 in | Wt 158.0 lb

## 2011-09-09 DIAGNOSIS — M79645 Pain in left finger(s): Secondary | ICD-10-CM

## 2011-09-09 DIAGNOSIS — M79609 Pain in unspecified limb: Secondary | ICD-10-CM

## 2011-09-09 NOTE — Patient Instructions (Signed)
An x-ray is requested for you today.  You will receive a letter with results. I hope you feel better soon.  If you don't feel better in 1-2 weeks, please call back.

## 2011-09-09 NOTE — Progress Notes (Signed)
  Subjective:    Patient ID: Derek [name], male    DOB: [birth year], 36 y.o.   MRN: [id number]  HPI 3 1/2 weeks ago, pt noted sudden-onset pain at the left middle finger, and assoc "pop" sound.  This happened in the context of bowling.  Pain has persisted since then, but has improved with rest.   Past Medical History  Diagnosis Date  . ALLERGIC RHINITIS CAUSE UNSPECIFIED 06/01/2009    No past surgical history on file.  History   Social History  . Marital Status: Married    Spouse Name: N/A    Number of Children: N/A  . Years of Education: N/A   Occupational History  . sales at [employer]    Social History Main Topics  . Smoking status: Never Smoker   . Smokeless tobacco: Not on file  . Alcohol Use: No  . Drug Use: No  . Sexually Active: Yes    Birth Control/ Protection: None   Other Topics Concern  . Not on file   Social History Narrative  . No narrative on file    Current Outpatient Prescriptions on File Prior to Visit  Medication Sig Dispense Refill  . montelukast (SINGULAIR) 10 MG tablet Take 10 mg by mouth daily.        Marland Kitchen triamcinolone (NASACORT) 55 MCG/ACT nasal inhaler USE 2 SPRAYS IN EACH NOSTRIL ONCE A DAY  16.5 g  2  . fexofenadine (ALLEGRA) 180 MG tablet Take 180 mg by mouth daily.          No Known Allergies  Family History  Problem Relation Age of Onset  . Arthritis Other   . Hypertension Other   . Hyperlipidemia Other     BP 122/72  Pulse 61  Temp(Src) 98.6 F (37 C) (Oral)  Ht 5\' 11"  (1.803 m)  Wt 158 lb (71.668 kg)  BMI 22.04 kg/m2  SpO2 99%  Review of Systems He also has slight swelling then    Objective:   Physical Exam VITAL SIGNS:  See vs page GENERAL: no distress Left middle finger: slight swelling at the proximal aspect.  Normal color.  sensation is intact to touch.  Full rom without pain.    X-ray: nad    Assessment & Plan:  Finger sprain, new

## 2011-09-10 ENCOUNTER — Telehealth: Payer: Self-pay | Admitting: *Deleted

## 2011-09-10 NOTE — Telephone Encounter (Signed)
Called pt to inform of xray results, pt informed (letter also mailed to pt).  

## 2011-10-06 ENCOUNTER — Ambulatory Visit (INDEPENDENT_AMBULATORY_CARE_PROVIDER_SITE_OTHER)
Admission: RE | Admit: 2011-10-06 | Discharge: 2011-10-06 | Disposition: A | Discharge status: Home / Self Care | Payer: Managed Care, Other (non HMO) | Source: Ambulatory Visit | Attending: Internal Medicine | Admitting: Internal Medicine

## 2011-10-06 ENCOUNTER — Ambulatory Visit (INDEPENDENT_AMBULATORY_CARE_PROVIDER_SITE_OTHER): Payer: Managed Care, Other (non HMO) | Admitting: Internal Medicine

## 2011-10-06 ENCOUNTER — Encounter: Payer: Self-pay | Admitting: Internal Medicine

## 2011-10-06 VITALS — BP 116/62 | HR 53 | Temp 98.6°F | Resp 16 | Wt 153.5 lb

## 2011-10-06 DIAGNOSIS — M79645 Pain in left finger(s): Secondary | ICD-10-CM

## 2011-10-06 DIAGNOSIS — M79609 Pain in unspecified limb: Secondary | ICD-10-CM

## 2011-10-06 MED ORDER — DICLOFENAC SODIUM 1.5 % TD SOLN: 0.2000 mL | Freq: Three times a day (TID) | TRANSDERMAL | Status: DC

## 2011-10-06 NOTE — Assessment & Plan Note (Signed)
It appears that he has a sprain in the PIP joint of the LMF, I will recheck an xray today to look for bone fragments or delayed development of a fracture, I have asked him to use Pennsaid to treat the pain and swelling, I put the finger in a soft finger splint immobilizer and asked him to keep it immobilized for the next 3 weeks

## 2011-10-06 NOTE — Progress Notes (Signed)
  Subjective:    Patient ID: Derek [name], male    DOB: December 07, 1975, 36 y.o.   MRN: [id number]  Hand Pain  The incident occurred more than 1 week ago. Incident location: while bowling. The injury mechanism was twisted. Pain location: left middle finger. Quality: stiffness and swelling. The pain does not radiate. The pain is at a severity of 2/10. The pain is mild. The pain has been intermittent since the incident. Pertinent negatives include no chest pain, muscle weakness, numbness or tingling. The symptoms are aggravated by movement. He has tried ice for the symptoms. The treatment provided mild relief.      Review of Systems  Constitutional: Negative.   HENT: Negative.   Eyes: Negative.   Respiratory: Negative.   Cardiovascular: Negative.  Negative for chest pain.  Gastrointestinal: Negative.   Genitourinary: Negative.   Musculoskeletal: Positive for arthralgias (LMF). Negative for myalgias, back pain, joint swelling and gait problem.  Skin: Negative.   Neurological: Negative.  Negative for tingling and numbness.  Hematological: Negative.   Psychiatric/Behavioral: Negative.        Objective:   Physical Exam  Musculoskeletal:       Left hand: He exhibits tenderness and swelling. He exhibits normal range of motion, no bony tenderness, normal capillary refill, no deformity and no laceration. normal sensation noted. Normal strength noted.       Hands:      LMF shows mild swelling and ttp in the PIP joint, there are no boney derangements, the joint is stable with excellent flexion and extension and there is no crepitance      Dg Finger Middle Left  09/09/2011  *RADIOLOGY REPORT*  Clinical Data:  finger injury while bowling  LEFT MIDDLE FINGER 2+V  Comparison: None.  Findings: There is no evidence of fracture or dislocation.  There is no evidence of arthropathy or other focal bone abnormality. Soft tissues are unremarkable.  IMPRESSION: Negative exam.  Original Report Authenticated By:  Rosealee Albee, M.D.     Assessment & Plan:

## 2011-10-06 NOTE — Patient Instructions (Signed)

## 2011-11-13 ENCOUNTER — Other Ambulatory Visit: Payer: Self-pay | Admitting: Internal Medicine

## 2012-03-18 ENCOUNTER — Ambulatory Visit (INDEPENDENT_AMBULATORY_CARE_PROVIDER_SITE_OTHER): Payer: Managed Care, Other (non HMO) | Admitting: Internal Medicine

## 2012-03-18 ENCOUNTER — Encounter: Payer: Self-pay | Admitting: Internal Medicine

## 2012-03-18 VITALS — BP 122/74 | HR 72 | Temp 97.4°F | Ht 72.0 in | Wt 168.0 lb

## 2012-03-18 DIAGNOSIS — Z23 Encounter for immunization: Secondary | ICD-10-CM

## 2012-03-18 DIAGNOSIS — IMO0001 Reserved for inherently not codable concepts without codable children: Secondary | ICD-10-CM | POA: Insufficient documentation

## 2012-03-18 DIAGNOSIS — R1032 Left lower quadrant pain: Secondary | ICD-10-CM

## 2012-03-18 NOTE — Assessment & Plan Note (Signed)
Exam benign, suspect ? msk strain or early hernia without overt swelling on exam;  Ok to follow with reduced abd use in exercise, to f/u with any overt groin swelling, increased pain or other

## 2012-03-18 NOTE — Patient Instructions (Addendum)
Please hold off on exercise that involves increased abdominal pressure for 4 wks, then resume at light loads thereafter, or avoid altogether and focus on other exercise such as aerobic exercises rather than lifting weights or sit-ups. No specific treatment or xrays are needed today. If the area does become more painful and swollen in the future, please return to assess for an inguinal hernia, that might need surgury to repair. Thank you for enrolling in MyChart. Please follow the instructions below to securely access your online medical record. MyChart allows you to send messages to your doctor, view your test results, renew your prescriptions, schedule appointments, and more.

## 2012-03-18 NOTE — Progress Notes (Signed)
  Subjective:    Patient ID: Derek [name], male    DOB: 03-Feb-1976, 36 y.o.   MRN: [id number]  HPI  Here to mention pain to the medial left prox leg at the gracilis insertion site area that resolved after a day or two  Of sharp mild pain after lifting wts without recurrence,but also c/o main issue of mild recurring pain to the left low mid abd after gym work with lifting wts and situps;  Has mulf family with hx of groin hernias and he is concerned but no overt swelling to the area;  Feels a sort of pinching pain in the past 2 wks, Denies worsening reflux, dysphagia, abd pain, n/v, bowel change or blood.  Pt denies chest pain, increased sob or doe, wheezing, orthopnea, PND, increased LE swelling, palpitations, dizziness or syncope.    Denies urinary symptoms such as dysuria, frequency, urgency,or hematuria. Past Medical History  Diagnosis Date  . ALLERGIC RHINITIS CAUSE UNSPECIFIED 06/01/2009   No past surgical history on file.  reports that he has never smoked. He does not have any smokeless tobacco history on file. He reports that he does not drink alcohol or use illicit drugs. family history includes Arthritis in his other; Hyperlipidemia in his other; and Hypertension in his other. No Known Allergies Current Outpatient Prescriptions on File Prior to Visit  Medication Sig Dispense Refill  . montelukast (SINGULAIR) 10 MG tablet Take 10 mg by mouth daily.        . Diclofenac Sodium (PENNSAID) 1.5 % SOLN Place 0.2 mLs onto the skin 3 (three) times daily.  60 mL  0  . fexofenadine (ALLEGRA) 180 MG tablet Take 180 mg by mouth daily.        Marland Kitchen triamcinolone (NASACORT) 55 MCG/ACT nasal inhaler USE 2 SPRAYS IN EACH NOSTRIL ONCE A DAY  16.5 g  6   Review of Systems All otherwise neg per pt     Objective:   Physical Exam BP 122/74  Pulse 72  Temp 97.4 F (36.3 C) (Oral)  Ht 6' (1.829 m)  Wt 168 lb (76.204 kg)  BMI 22.78 kg/m2  SpO2 99% Physical Exam  VS noted Constitutional: Pt appears  well-developed and well-nourished.  HENT: Head: Normocephalic.  Right Ear: External ear normal.  Left Ear: External ear normal.  Eyes: Conjunctivae and EOM are normal. Pupils are equal, round, and reactive to light.  Neck: Normal range of motion. Neck supple.  Cardiovascular: Normal rate and regular rhythm.   Pulmonary/Chest: Effort normal and breath sounds normal.  Abd:  Soft, NT, non-distended, + BS Neurological: Pt is alert. Not confused , LE motor/dtr intact Skin: Skin is warm. No erythema.  Psychiatric: Pt behavior is normal. Thought content normal.  No overt groin LA, mass or hernia, or tenderness, left hip FROM without pain; penis/scrotal contents normal appearacne without red/sweling/tender or d/c    Assessment & Plan:

## 2012-05-19 ENCOUNTER — Encounter (INDEPENDENT_AMBULATORY_CARE_PROVIDER_SITE_OTHER): Payer: Self-pay | Admitting: Surgery

## 2012-05-19 ENCOUNTER — Ambulatory Visit (INDEPENDENT_AMBULATORY_CARE_PROVIDER_SITE_OTHER): Payer: Managed Care, Other (non HMO) | Admitting: Surgery

## 2012-05-19 VITALS — BP 121/66 | HR 76 | Temp 99.0°F | Resp 16 | Ht 72.0 in | Wt 164.0 lb

## 2012-05-19 DIAGNOSIS — K4020 Bilateral inguinal hernia, without obstruction or gangrene, not specified as recurrent: Secondary | ICD-10-CM | POA: Insufficient documentation

## 2012-05-19 NOTE — Progress Notes (Signed)
Patient ID: Derek [name], male   DOB: 07/18/1975, 37 y.o.   MRN: [id number]  Chief Complaint  Patient presents with  . Hernia    HPI Derek [name] is a 37 y.o. male.   HPI This gentleman is a self-referral. He's been having discomfort in his left groin since October after heavy lifting. He originally had discomfort along his left thigh as well but this has resolved. He has not noticed a bulge. He has had no obstructive symptoms. The pain is mild and intermittent. He is otherwise without complaints. Past Medical History  Diagnosis Date  . ALLERGIC RHINITIS CAUSE UNSPECIFIED 06/01/2009    History reviewed. No pertinent past surgical history.  Family History  Problem Relation Age of Onset  . Arthritis Other   . Hypertension Other   . Hyperlipidemia Other     Social History History  Substance Use Topics  . Smoking status: Never Smoker   . Smokeless tobacco: Not on file  . Alcohol Use: No    No Known Allergies  Current Outpatient Prescriptions  Medication Sig Dispense Refill  . Diclofenac Sodium (PENNSAID) 1.5 % SOLN Place 0.2 mLs onto the skin 3 (three) times daily.  60 mL  0  . fexofenadine (ALLEGRA) 180 MG tablet Take 180 mg by mouth daily.        . montelukast (SINGULAIR) 10 MG tablet Take 10 mg by mouth daily.        Marland Kitchen triamcinolone (NASACORT) 55 MCG/ACT nasal inhaler USE 2 SPRAYS IN EACH NOSTRIL ONCE A DAY  16.5 g  6    Review of Systems Review of Systems  Constitutional: Negative for fever, chills and unexpected weight change.  HENT: Negative for hearing loss, congestion, sore throat, trouble swallowing and voice change.   Eyes: Negative for visual disturbance.  Respiratory: Negative for cough and wheezing.   Cardiovascular: Negative for chest pain, palpitations and leg swelling.  Gastrointestinal: Negative for nausea, vomiting, abdominal pain, diarrhea, constipation, blood in stool, abdominal distention, anal bleeding and rectal pain.  Genitourinary: Negative  for hematuria and difficulty urinating.  Musculoskeletal: Negative for arthralgias.  Skin: Negative for rash and wound.  Neurological: Negative for seizures, syncope, weakness and headaches.  Hematological: Negative for adenopathy. Does not bruise/bleed easily.  Psychiatric/Behavioral: Negative for confusion.    Blood pressure 121/66, pulse 76, temperature 99 F (37.2 C), temperature source Temporal, resp. rate 16, height 6' (1.829 m), weight 164 lb (74.39 kg).  Physical Exam Physical Exam  Constitutional: He is oriented to person, place, and time. He appears well-developed and well-nourished. No distress.  HENT:  Head: Normocephalic and atraumatic.  Right Ear: External ear normal.  Left Ear: External ear normal.  Nose: Nose normal.  Mouth/Throat: Oropharynx is clear and moist. No oropharyngeal exudate.  Eyes: Conjunctivae normal are normal. Pupils are equal, round, and reactive to light. Right eye exhibits no discharge. Left eye exhibits no discharge. No scleral icterus.  Neck: Normal range of motion. Neck supple. No tracheal deviation present.  Cardiovascular: Normal rate, regular rhythm, normal heart sounds and intact distal pulses.   No murmur heard. Pulmonary/Chest: Effort normal and breath sounds normal. No respiratory distress. He has no wheezes.  Abdominal: Soft. Bowel sounds are normal. He exhibits no distension. There is no tenderness. There is no rebound.       There is a small, easily reducible left inguinal hernia. There is an even smaller right inguinal hernia. There is no umbilical hernia  Musculoskeletal: Normal range of motion. He  exhibits no edema and no tenderness.  Neurological: He is alert and oriented to person, place, and time.  Skin: Skin is warm and dry. No rash noted. He is not diaphoretic. No erythema.  Psychiatric: His behavior is normal. Judgment normal.    Data Reviewed   Assessment    Bilateral inguinal hernia    Plan    Repair with mesh was  recommended as he is symptomatic. I discussed both the laparoscopic and open techniques. He is eager to proceed with laparoscopic bilateral inguinal hernia repair with mesh. I discussed the risk of surgery which includes but is not limited to bleeding, infection, nerve entrapment, chronic pain, recurrence, et Karie Soda. He understands and wishes to proceed. Likelihood of success is good       Derek [name] A 05/19/2012, 3:46 PM

## 2012-05-25 ENCOUNTER — Encounter (HOSPITAL_COMMUNITY): Payer: Self-pay | Admitting: Pharmacy Technician

## 2012-05-27 NOTE — Patient Instructions (Addendum)
20 RUDRA HOBBINS  05/27/2012   Your procedure is scheduled on: 06/07/12  Report to [facility] at [phone].  Call this number if you have problems the morning of surgery 336-: [phone]   Remember:   Do not eat food or drink liquids After Midnight.     Take these medicines the morning of surgery with A SIP OF WATER: claritin   Do not wear jewelry, make-up or nail polish.  Do not wear lotions, powders, or perfumes. You may wear deodorant.  Do not shave 48 hours prior to surgery. Men may shave face and neck.  Do not bring valuables to the hospital.  Contacts, dentures or bridgework may not be worn into surgery.   Patients discharged the day of surgery will not be allowed to drive home.  Name and phone number of your driver:Meredith [phone]   Please read over the following fact sheets that you were given: MRSA Information.  Birdie Sons, RN  pre op nurse call if needed [phone]    FAILURE TO FOLLOW THESE INSTRUCTIONS MAY RESULT IN CANCELLATION OF YOUR SURGERY   Patient Signature: ___________________________________________

## 2012-05-28 ENCOUNTER — Encounter (HOSPITAL_COMMUNITY): Payer: Self-pay

## 2012-05-28 ENCOUNTER — Encounter (HOSPITAL_COMMUNITY)
Admission: RE | Admit: 2012-05-28 | Discharge: 2012-05-28 | Disposition: A | Discharge status: Home / Self Care | Payer: Managed Care, Other (non HMO) | Source: Ambulatory Visit | Attending: Surgery | Admitting: Surgery

## 2012-05-28 HISTORY — DX: Other complications of anesthesia, initial encounter: T88.59XA

## 2012-05-28 HISTORY — DX: Adverse effect of unspecified anesthetic, initial encounter: T41.45XA

## 2012-05-28 HISTORY — DX: Other specified health status: Z78.9

## 2012-05-29 LAB — SURGICAL PCR SCREEN: Staphylococcus aureus: POSITIVE — AB

## 2012-06-06 MED ORDER — CEFAZOLIN SODIUM-DEXTROSE 2-3 GM-% IV SOLR
2.0000 g | INTRAVENOUS | Status: AC
  Administered 2012-06-07: 2 g via INTRAVENOUS

## 2012-06-06 NOTE — H&P (Signed)
Patient ID: Derek [name], [name] DOB: 1975-12-29, 37 y.o. MRN: [id number]  Chief Complaint   Patient presents with   .  Hernia    HPI  Derek [name] is a 37 y.o. [name].  HPI  This gentleman is a self-referral. He's been having discomfort in his left groin since October after heavy lifting. He originally had discomfort along his left thigh as well but this has resolved. He has not noticed a bulge. He has had no obstructive symptoms. The pain is mild and intermittent. He is otherwise without complaints.  Past Medical History   Diagnosis  Date   .  ALLERGIC RHINITIS CAUSE UNSPECIFIED  06/01/2009    History reviewed. No pertinent past surgical history.  Family History   Problem  Relation  Age of Onset   .  Arthritis  Other    .  Hypertension  Other    .  Hyperlipidemia  Other     Social History  History   Substance Use Topics   .  Smoking status:  Never Smoker   .  Smokeless tobacco:  Not on file   .  Alcohol Use:  No    No Known Allergies  Current Outpatient Prescriptions   Medication  Sig  Dispense  Refill   .  Diclofenac Sodium (PENNSAID) 1.5 % SOLN  Place 0.2 mLs onto the skin 3 (three) times daily.  60 mL  0   .  fexofenadine (ALLEGRA) 180 MG tablet  Take 180 mg by mouth daily.     .  montelukast (SINGULAIR) 10 MG tablet  Take 10 mg by mouth daily.     Marland Kitchen  triamcinolone (NASACORT) 55 MCG/ACT nasal inhaler  USE 2 SPRAYS IN EACH NOSTRIL ONCE A DAY  16.5 g  6    Review of Systems  Review of Systems  Constitutional: Negative for fever, chills and unexpected weight change.  HENT: Negative for hearing loss, congestion, sore throat, trouble swallowing and voice change.  Eyes: Negative for visual disturbance.  Respiratory: Negative for cough and wheezing.  Cardiovascular: Negative for chest pain, palpitations and leg swelling.  Gastrointestinal: Negative for nausea, vomiting, abdominal pain, diarrhea, constipation, blood in stool, abdominal distention, anal bleeding and rectal  pain.  Genitourinary: Negative for hematuria and difficulty urinating.  Musculoskeletal: Negative for arthralgias.  Skin: Negative for rash and wound.  Neurological: Negative for seizures, syncope, weakness and headaches.  Hematological: Negative for adenopathy. Does not bruise/bleed easily.  Psychiatric/Behavioral: Negative for confusion.   Blood pressure 121/66, pulse 76, temperature 99 F (37.2 C), temperature source Temporal, resp. rate 16, height 6' (1.829 m), weight 164 lb (74.39 kg).  Physical Exam  Physical Exam  Constitutional: He is oriented to person, place, and time. He appears well-developed and well-nourished. No distress.  HENT:  Head: Normocephalic and atraumatic.  Right Ear: External ear normal.  Left Ear: External ear normal.  Nose: Nose normal.  Mouth/Throat: Oropharynx is clear and moist. No oropharyngeal exudate.  Eyes: Conjunctivae normal are normal. Pupils are equal, round, and reactive to light. Right eye exhibits no discharge. Left eye exhibits no discharge. No scleral icterus.  Neck: Normal range of motion. Neck supple. No tracheal deviation present.  Cardiovascular: Normal rate, regular rhythm, normal heart sounds and intact distal pulses.  No murmur heard.  Pulmonary/Chest: Effort normal and breath sounds normal. No respiratory distress. He has no wheezes.  Abdominal: Soft. Bowel sounds are normal. He exhibits no distension. There is no tenderness. There is no rebound.  There is a small, easily reducible left inguinal hernia. There is an even smaller right inguinal hernia. There is no umbilical hernia  Musculoskeletal: Normal range of motion. He exhibits no edema and no tenderness.  Neurological: He is alert and oriented to person, place, and time.  Skin: Skin is warm and dry. No rash noted. He is not diaphoretic. No erythema.  Psychiatric: His behavior is normal. Judgment normal.   Data Reviewed  Assessment   Bilateral inguinal hernia   Plan   Repair  with mesh was recommended as he is symptomatic. I discussed both the laparoscopic and open techniques. He is eager to proceed with laparoscopic bilateral inguinal hernia repair with mesh. I discussed the risk of surgery which includes but is not limited to bleeding, infection, nerve entrapment, chronic pain, recurrence, et Karie Soda. He understands and wishes to proceed. Likelihood of success is good

## 2012-06-07 ENCOUNTER — Ambulatory Visit (HOSPITAL_COMMUNITY)
Admission: RE | Admit: 2012-06-07 | Discharge: 2012-06-07 | Disposition: A | Discharge status: Home / Self Care | Payer: Managed Care, Other (non HMO) | Source: Ambulatory Visit | Attending: Surgery | Admitting: Surgery

## 2012-06-07 ENCOUNTER — Encounter (HOSPITAL_COMMUNITY): Payer: Self-pay | Admitting: *Deleted

## 2012-06-07 ENCOUNTER — Encounter (HOSPITAL_COMMUNITY)
Admission: RE | Disposition: A | Discharge status: Home / Self Care | Payer: Self-pay | Source: Ambulatory Visit | Attending: Surgery

## 2012-06-07 ENCOUNTER — Ambulatory Visit (HOSPITAL_COMMUNITY): Payer: Managed Care, Other (non HMO) | Admitting: Anesthesiology

## 2012-06-07 ENCOUNTER — Encounter (HOSPITAL_COMMUNITY): Payer: Self-pay | Admitting: Anesthesiology

## 2012-06-07 DIAGNOSIS — K4020 Bilateral inguinal hernia, without obstruction or gangrene, not specified as recurrent: Secondary | ICD-10-CM | POA: Insufficient documentation

## 2012-06-07 DIAGNOSIS — Z01812 Encounter for preprocedural laboratory examination: Secondary | ICD-10-CM | POA: Insufficient documentation

## 2012-06-07 DIAGNOSIS — I10 Essential (primary) hypertension: Secondary | ICD-10-CM | POA: Insufficient documentation

## 2012-06-07 DIAGNOSIS — E785 Hyperlipidemia, unspecified: Secondary | ICD-10-CM | POA: Insufficient documentation

## 2012-06-07 HISTORY — PX: INGUINAL HERNIA REPAIR: SHX194

## 2012-06-07 HISTORY — PX: INSERTION OF MESH: SHX5868

## 2012-06-07 SURGERY — REPAIR, HERNIA, INGUINAL, BILATERAL, LAPAROSCOPIC
Anesthesia: General | Site: Groin | Laterality: Bilateral | Wound class: Clean

## 2012-06-07 MED ORDER — OXYCODONE HCL 5 MG/5ML PO SOLN
5.0000 mg | Freq: Once | ORAL | Status: DC | PRN
  Filled 2012-06-07: qty 5

## 2012-06-07 MED ORDER — SODIUM CHLORIDE 0.9 % IJ SOLN: 3.0000 mL | Freq: Two times a day (BID) | INTRAMUSCULAR | Status: DC

## 2012-06-07 MED ORDER — OXYCODONE HCL 5 MG PO TABS: 5.0000 mg | ORAL_TABLET | Freq: Once | ORAL | Status: DC | PRN

## 2012-06-07 MED ORDER — GLYCOPYRROLATE 0.2 MG/ML IJ SOLN
INTRAMUSCULAR | Status: DC | PRN
  Administered 2012-06-07: 0.6 mg via INTRAVENOUS

## 2012-06-07 MED ORDER — CEFAZOLIN SODIUM-DEXTROSE 2-3 GM-% IV SOLR
INTRAVENOUS | Status: AC
  Filled 2012-06-07: qty 50

## 2012-06-07 MED ORDER — CISATRACURIUM BESYLATE (PF) 10 MG/5ML IV SOLN
INTRAVENOUS | Status: DC | PRN
  Administered 2012-06-07: 7 mg via INTRAVENOUS

## 2012-06-07 MED ORDER — BUPIVACAINE HCL (PF) 0.5 % IJ SOLN
INTRAMUSCULAR | Status: DC | PRN
  Administered 2012-06-07: 19 mL

## 2012-06-07 MED ORDER — ONDANSETRON HCL 4 MG/2ML IJ SOLN
INTRAMUSCULAR | Status: DC | PRN
  Administered 2012-06-07: 4 mg via INTRAVENOUS

## 2012-06-07 MED ORDER — ACETAMINOPHEN 325 MG PO TABS: 650.0000 mg | ORAL_TABLET | ORAL | Status: DC | PRN

## 2012-06-07 MED ORDER — OXYCODONE HCL 5 MG PO TABS
5.0000 mg | ORAL_TABLET | ORAL | Status: DC | PRN
Start: 2012-06-07 — End: 2012-06-07
  Administered 2012-06-07: 5 mg via ORAL
  Filled 2012-06-07: qty 1

## 2012-06-07 MED ORDER — FENTANYL CITRATE 0.05 MG/ML IJ SOLN
INTRAMUSCULAR | Status: DC | PRN
  Administered 2012-06-07: 100 ug via INTRAVENOUS

## 2012-06-07 MED ORDER — ONDANSETRON HCL 4 MG/2ML IJ SOLN: 4.0000 mg | Freq: Four times a day (QID) | INTRAMUSCULAR | Status: DC | PRN

## 2012-06-07 MED ORDER — HYDROMORPHONE HCL PF 1 MG/ML IJ SOLN: 0.2500 mg | INTRAMUSCULAR | Status: DC | PRN

## 2012-06-07 MED ORDER — ACETAMINOPHEN 10 MG/ML IV SOLN: 1000.0000 mg | Freq: Once | INTRAVENOUS | Status: DC | PRN

## 2012-06-07 MED ORDER — MEPERIDINE HCL 50 MG/ML IJ SOLN: 6.2500 mg | INTRAMUSCULAR | Status: DC | PRN

## 2012-06-07 MED ORDER — HYDROCODONE-ACETAMINOPHEN 5-325 MG PO TABS: 1.0000 | ORAL_TABLET | ORAL | Status: DC | PRN

## 2012-06-07 MED ORDER — BUPIVACAINE HCL (PF) 0.5 % IJ SOLN
INTRAMUSCULAR | Status: AC
  Filled 2012-06-07: qty 30

## 2012-06-07 MED ORDER — SODIUM CHLORIDE 0.9 % IJ SOLN: 3.0000 mL | INTRAMUSCULAR | Status: DC | PRN

## 2012-06-07 MED ORDER — KETOROLAC TROMETHAMINE 30 MG/ML IJ SOLN
INTRAMUSCULAR | Status: DC | PRN
  Administered 2012-06-07: 30 mg via INTRAVENOUS

## 2012-06-07 MED ORDER — ACETAMINOPHEN 10 MG/ML IV SOLN
INTRAVENOUS | Status: AC
  Filled 2012-06-07: qty 100

## 2012-06-07 MED ORDER — NEOSTIGMINE METHYLSULFATE 1 MG/ML IJ SOLN
INTRAMUSCULAR | Status: DC | PRN
  Administered 2012-06-07: 4 mg via INTRAVENOUS

## 2012-06-07 MED ORDER — PROPOFOL 10 MG/ML IV BOLUS
INTRAVENOUS | Status: DC | PRN
  Administered 2012-06-07: 150 mg via INTRAVENOUS

## 2012-06-07 MED ORDER — LACTATED RINGERS IV SOLN
INTRAVENOUS | Status: DC | PRN
  Administered 2012-06-07 (×2): via INTRAVENOUS

## 2012-06-07 MED ORDER — ACETAMINOPHEN 650 MG RE SUPP
650.0000 mg | RECTAL | Status: DC | PRN
  Filled 2012-06-07: qty 1

## 2012-06-07 MED ORDER — MORPHINE SULFATE 10 MG/ML IJ SOLN: 4.0000 mg | INTRAMUSCULAR | Status: DC | PRN

## 2012-06-07 MED ORDER — ACETAMINOPHEN 10 MG/ML IV SOLN
INTRAVENOUS | Status: DC | PRN
  Administered 2012-06-07: 1000 mg via INTRAVENOUS

## 2012-06-07 MED ORDER — DEXAMETHASONE SODIUM PHOSPHATE 10 MG/ML IJ SOLN
INTRAMUSCULAR | Status: DC | PRN
  Administered 2012-06-07: 10 mg via INTRAVENOUS

## 2012-06-07 MED ORDER — SODIUM CHLORIDE 0.9 % IV SOLN: 250.0000 mL | INTRAVENOUS | Status: DC | PRN

## 2012-06-07 MED ORDER — PROMETHAZINE HCL 25 MG/ML IJ SOLN: 6.2500 mg | INTRAMUSCULAR | Status: DC | PRN

## 2012-06-07 MED ORDER — MIDAZOLAM HCL 5 MG/5ML IJ SOLN
INTRAMUSCULAR | Status: DC | PRN
  Administered 2012-06-07: 2 mg via INTRAVENOUS

## 2012-06-07 SURGICAL SUPPLY — 32 items
BANDAGE ADHESIVE 1X3 (GAUZE/BANDAGES/DRESSINGS) IMPLANT
BENZOIN TINCTURE PRP APPL 2/3 (GAUZE/BANDAGES/DRESSINGS) ×2 IMPLANT
CLOTH BEACON ORANGE TIMEOUT ST (SAFETY) ×2 IMPLANT
DECANTER SPIKE VIAL GLASS SM (MISCELLANEOUS) ×2 IMPLANT
DEVICE SECURE STRAP 25 ABSORB (INSTRUMENTS) ×4 IMPLANT
DISSECT BALLN SPACEMKR + OVL (BALLOONS) ×2
DISSECTOR BALLN SPACEMKR + OVL (BALLOONS) ×1 IMPLANT
DISSECTOR BLUNT TIP ENDO 5MM (MISCELLANEOUS) IMPLANT
DRAPE LAPAROSCOPIC ABDOMINAL (DRAPES) ×2 IMPLANT
DRSG TEGADERM 4X4.75 (GAUZE/BANDAGES/DRESSINGS) ×2 IMPLANT
ELECT REM PT RETURN 9FT ADLT (ELECTROSURGICAL) ×2
ELECTRODE REM PT RTRN 9FT ADLT (ELECTROSURGICAL) ×1 IMPLANT
GLOVE BIOGEL PI IND STRL 7.0 (GLOVE) ×1 IMPLANT
GLOVE BIOGEL PI INDICATOR 7.0 (GLOVE) ×1
GLOVE SURG SIGNA 7.5 PF LTX (GLOVE) ×2 IMPLANT
GOWN STRL NON-REIN LRG LVL3 (GOWN DISPOSABLE) ×2 IMPLANT
GOWN STRL REIN XL XLG (GOWN DISPOSABLE) ×4 IMPLANT
KIT BASIN OR (CUSTOM PROCEDURE TRAY) ×2 IMPLANT
MARKER SKIN DUAL TIP RULER LAB (MISCELLANEOUS) ×2 IMPLANT
MESH 3DMAX LIGHT 4.1X6.2 LT LR (Mesh General) ×2 IMPLANT
MESH 3DMAX LIGHT 4.1X6.2 RT LR (Mesh General) ×2 IMPLANT
NEEDLE INSUFFLATION 14GA 120MM (NEEDLE) IMPLANT
SCISSORS LAP 5X35 DISP (ENDOMECHANICALS) IMPLANT
SET IRRIG TUBING LAPAROSCOPIC (IRRIGATION / IRRIGATOR) IMPLANT
SOLUTION ANTI FOG 6CC (MISCELLANEOUS) ×2 IMPLANT
STRIP CLOSURE SKIN 1/2X4 (GAUZE/BANDAGES/DRESSINGS) ×2 IMPLANT
SUT MNCRL AB 4-0 PS2 18 (SUTURE) ×2 IMPLANT
TOWEL OR 17X26 10 PK STRL BLUE (TOWEL DISPOSABLE) ×2 IMPLANT
TRAY FOLEY CATH 14FRSI W/METER (CATHETERS) ×2 IMPLANT
TRAY LAP CHOLE (CUSTOM PROCEDURE TRAY) ×2 IMPLANT
TROCAR CANNULA W/PORT DUAL 5MM (MISCELLANEOUS) ×2 IMPLANT
TUBING INSUFFLATION 10FT LAP (TUBING) ×2 IMPLANT

## 2012-06-07 NOTE — Transfer of Care (Signed)
Immediate Anesthesia Transfer of Care Note  Patient: Derek [name]  Procedure(s) Performed: Procedure(s) (LRB) with comments: LAPAROSCOPIC BILATERAL INGUINAL HERNIA REPAIR (Bilateral) INSERTION OF MESH (Bilateral)  Patient Location: PACU  Anesthesia Type:General  Level of Consciousness: awake, sedated and patient cooperative  Airway & Oxygen Therapy: Patient Spontanous Breathing and Patient connected to face mask oxygen  Post-op Assessment: Report given to PACU RN and Post -op Vital signs reviewed and stable  Post vital signs: Reviewed and stable  Complications: No apparent anesthesia complications

## 2012-06-07 NOTE — Op Note (Signed)
LAPAROSCOPIC BILATERAL INGUINAL HERNIA REPAIR, INSERTION OF MESH  Procedure Note  Derek [name] 06/07/2012   Pre-op Diagnosis: bilateral inguinal hernia      Post-op Diagnosis: same  Procedure(s): LAPAROSCOPIC BILATERAL INGUINAL HERNIA REPAIR INSERTION OF MESH (Bard large 3-D light)  Surgeon(s): Shelly Rubenstein, MD  Anesthesia: General  Staff:  Gerda Diss, RN - Circulator Beryl Meager, CST - Scrub Person Misty Clifton Custard, CST - Scrub Person  Estimated Blood Loss: Minimal               Findings: The patient was found to have bilateral small direct inguinal hernias. These were repaired with 2 separate pieces of Bard large Prolene 3-D. Light mesh  Procedure: The patient was brought to the operating room and identified as the correct patient. Derek [name] was placed supine on the operating room table and general anesthesia was induced. A Foley catheter is inserted. Derek [name] abdomen was then prepped and draped in the usual sterile fashion. I made a small incision below the umbilicus. I carried this down to the fascia which was then opened. The rectus muscle was elevated and the dissecting balloon was passed underneath the rectus muscle and manipulated toward the pubis. The dissecting balloon was then insufflated under direct vision dissecting out the preperitoneal space. Excellent dissection appeared to be achieved. The dissecting balloon was then removed and insufflation was begun with carbon dioxide. I then placed 2 separate 5 mm ports in the patient's lower midline both under direct vision. I dissected out the left inguinal area worse. The patient had a small direct hernia defect. There was no evidence of indirect defect. I did reduce a small lipoma along the cord structures. Cooper's ligament was easily identified.  I then turned my attention toward the right inguinal area. The patient had a smaller direct hernia on this side. Again, there was no evidence of indirect defect. I dissected out the  cord structures well. I next brought a right-sided large piece of Bard 3-D light mesh onto the field. I placed it through the port at the umbilicus.  I then placed it as an onlay over the cord structures. I tacked it to Cooper's ligament up the medial abdominal wall and slightly laterally with the absorbable tacks were. I then brought a left-sided similar piece of mesh onto the field. I placed it through the port at the umbilicus. I likewise opened it as an onlay over the left cord structures. I then tacked it to Cooper's ligament up the medial abdominal wall and out slightly laterally with the absorbable tacker. Excellent coverage of both defects and cord structures appeared to be achieved. Hemostasis appeared to be achieved. The preperitoneal space was deflated and appeared to collapse appropriately with the mesh lying in place. All ports were then removed under direct vision. I closed the fascia at the umbilicus with a figure-of-eight 0 Vicryl suture. I then anesthetized all wounds Marcaine and performed ilioinguinal nerve blocks of Marcaine. The skin was closed with 4-0 Monocryl, Steri-Strips, and Band-Aids. The patient tolerated the procedure well. All counts were correct at the end of the procedure. The patient was then extubated in the operating room and taken in a stable condition to the recovery room.         Derek [name] A   Date: 06/07/2012  Time: 7:55 AM

## 2012-06-07 NOTE — Progress Notes (Signed)
Pt up in room and ambulated to bathroom with stand by assistance.  Pt tolerated well.  He voided moderate amount with slight burning sensation.  I encouraged him to drink plenty of fluids and the burning will decrease.

## 2012-06-07 NOTE — Interval H&P Note (Signed)
History and Physical Interval Note: no change in H and P  06/07/2012 6:58 AM  Derek [name]  has presented today for surgery, with the diagnosis of bilateral inguinal hernia   The various methods of treatment have been discussed with the patient and family. After consideration of risks, benefits and other options for treatment, the patient has consented to  Procedure(s) (LRB) with comments: LAPAROSCOPIC BILATERAL INGUINAL HERNIA REPAIR (Bilateral) INSERTION OF MESH (Bilateral) as a surgical intervention .  The patient's history has been reviewed, patient examined, no change in status, stable for surgery.  I have reviewed the patient's chart and labs.  Questions were answered to the patient's satisfaction.     [name] A

## 2012-06-07 NOTE — Anesthesia Preprocedure Evaluation (Addendum)
Anesthesia Evaluation  Patient identified by MRN, date of birth, ID band Patient awake    Reviewed: Allergy & Precautions, H&P , NPO status , Patient's Chart, lab work & pertinent test results  History of Anesthesia Complications Negative for: history of anesthetic complications  Airway Mallampati: I TM Distance: >3 FB Neck ROM: Full    Dental  (+) Dental Advisory Given and Teeth Intact   Pulmonary neg pulmonary ROS,  breath sounds clear to auscultation  Pulmonary exam normal       Cardiovascular negative cardio ROS  Rhythm:Regular Rate:Normal     Neuro/Psych negative neurological ROS  negative psych ROS   GI/Hepatic negative GI ROS, Neg liver ROS,   Endo/Other  negative endocrine ROS  Renal/GU negative Renal ROS     Musculoskeletal negative musculoskeletal ROS (+)   Abdominal   Peds  Hematology negative hematology ROS (+)   Anesthesia Other Findings   Reproductive/Obstetrics                          Anesthesia Physical Anesthesia Plan  ASA: II  Anesthesia Plan: General   Post-op Pain Management:    Induction: Intravenous  Airway Management Planned: LMA  Additional Equipment:   Intra-op Plan:   Post-operative Plan:   Informed Consent: I have reviewed the patients History and Physical, chart, labs and discussed the procedure including the risks, benefits and alternatives for the proposed anesthesia with the patient or authorized representative who has indicated his/her understanding and acceptance.   Dental advisory given  Plan Discussed with: CRNA  Anesthesia Plan Comments:         Anesthesia Quick Evaluation

## 2012-06-07 NOTE — Anesthesia Postprocedure Evaluation (Signed)
Anesthesia Post Note  Patient: Derek [name]  Procedure(s) Performed: Procedure(s) (LRB): LAPAROSCOPIC BILATERAL INGUINAL HERNIA REPAIR (Bilateral) INSERTION OF MESH (Bilateral)  Anesthesia type: General  Patient location: PACU  Post pain: Pain level controlled  Post assessment: Post-op Vital signs reviewed  Last Vitals: BP 104/52  Pulse 53  Temp 36.8 C (Oral)  Resp 16  SpO2 97%  Post vital signs: Reviewed  Level of consciousness: sedated  Complications: No apparent anesthesia complications

## 2012-06-08 ENCOUNTER — Telehealth (INDEPENDENT_AMBULATORY_CARE_PROVIDER_SITE_OTHER): Payer: Self-pay | Admitting: General Surgery

## 2012-06-08 ENCOUNTER — Encounter (HOSPITAL_COMMUNITY): Payer: Self-pay | Admitting: Surgery

## 2012-06-08 NOTE — Telephone Encounter (Signed)
Spoke with patient he states he is doing well. PO f/u  Visit  For  06/28/12 @ 4:40

## 2012-06-14 ENCOUNTER — Encounter (INDEPENDENT_AMBULATORY_CARE_PROVIDER_SITE_OTHER): Payer: Self-pay | Admitting: General Surgery

## 2012-06-21 ENCOUNTER — Telehealth (INDEPENDENT_AMBULATORY_CARE_PROVIDER_SITE_OTHER): Payer: Self-pay | Admitting: *Deleted

## 2012-06-21 NOTE — Telephone Encounter (Signed)
Patient states he is having issues with a cough at this time which is causing pain at the incision site.  Encouraged patient to [place] Delsium OTC to help with the cough and then also splint the abdomen when coughing.  Patient states understanding at this time and will call for any new symptoms.

## 2012-06-28 ENCOUNTER — Encounter (INDEPENDENT_AMBULATORY_CARE_PROVIDER_SITE_OTHER): Payer: Self-pay | Admitting: Surgery

## 2012-06-28 ENCOUNTER — Ambulatory Visit (INDEPENDENT_AMBULATORY_CARE_PROVIDER_SITE_OTHER): Payer: Managed Care, Other (non HMO) | Admitting: Surgery

## 2012-06-28 VITALS — BP 118/82 | HR 75 | Temp 98.7°F | Resp 18 | Ht 72.0 in | Wt 161.0 lb

## 2012-06-28 DIAGNOSIS — Z09 Encounter for follow-up examination after completed treatment for conditions other than malignant neoplasm: Secondary | ICD-10-CM

## 2012-06-28 NOTE — Progress Notes (Signed)
Subjective:     Patient ID: Derek [name], male   DOB: June 13, 1975, 37 y.o.   MRN: [id number]  HPI He is here for his first postop visit status post bilateral laparoscopic inguinal hernia repair with mesh.  He has been doing well, but developed a severe coughing spell several days after surgery and still has discomfort in the left groin  Review of Systems     Objective:   Physical Exam His incisions are well healed and with him standing and doing Valsalva maneuvers I could not feel a recurrent hernia. Again, at the time of surgery he had direct inguinal hernias bilaterally    Assessment:     Patient stable postop     Plan:     I am going to see him back in one month to reevaluate his groin. He will come back sooner if the pain worsens.

## 2012-07-26 ENCOUNTER — Encounter (INDEPENDENT_AMBULATORY_CARE_PROVIDER_SITE_OTHER): Payer: Self-pay | Admitting: Surgery

## 2012-07-26 ENCOUNTER — Ambulatory Visit (INDEPENDENT_AMBULATORY_CARE_PROVIDER_SITE_OTHER): Payer: Managed Care, Other (non HMO) | Admitting: Surgery

## 2012-07-26 VITALS — BP 121/68 | HR 70 | Temp 98.5°F | Resp 14 | Ht 72.0 in | Wt 161.0 lb

## 2012-07-26 DIAGNOSIS — Z09 Encounter for follow-up examination after completed treatment for conditions other than malignant neoplasm: Secondary | ICD-10-CM

## 2012-07-26 NOTE — Progress Notes (Signed)
Subjective:     Patient ID: Derek [name], male   DOB: 04-Dec-1975, 37 y.o.   MRN: [id number]  HPI He is here for another postop visit. He only has occasional pain in the left groin. He notices no bulge whatsoever  Review of Systems     Objective:   Physical Exam On exam, I can feel no recurrent hernias on either side. His incisions are well healed    Assessment:     Patient stable postop     Plan:     He may now resume normal activity. I will see him back as needed

## 2013-01-20 ENCOUNTER — Other Ambulatory Visit: Payer: Self-pay | Admitting: Internal Medicine

## 2013-02-04 ENCOUNTER — Ambulatory Visit (INDEPENDENT_AMBULATORY_CARE_PROVIDER_SITE_OTHER): Payer: Managed Care, Other (non HMO) | Admitting: Surgery

## 2013-02-04 ENCOUNTER — Encounter (INDEPENDENT_AMBULATORY_CARE_PROVIDER_SITE_OTHER): Payer: Self-pay | Admitting: Surgery

## 2013-02-04 VITALS — BP 118/70 | HR 72 | Resp 18 | Ht 72.0 in | Wt 162.0 lb

## 2013-02-04 DIAGNOSIS — R1032 Left lower quadrant pain: Secondary | ICD-10-CM

## 2013-02-04 NOTE — Progress Notes (Signed)
Subjective:     Patient ID: Derek [name], male   DOB: 1975-07-19, 37 y.o.   MRN: [id number]  HPI He is status post bilateral laparoscopic inguinal hernia repair with mesh in February 2014. He has been having some increasing left inguinal discomfort from time to time with lifting. He has not noticed a bulge.  Review of Systems     Objective:   Physical Exam    On exam, I cannot elicit any Recurrent hernia. His area of tenderness is along the cord structures medially on the left Assessment:     Left groin pain     Plan:     I think this is still some postsurgical discomfort. He can resume his normal activities. I recommended ice and anti-inflammatories. I will see him back in a month and if he is still having discomfort fullness there and ilioinguinal nerve block

## 2013-03-10 ENCOUNTER — Other Ambulatory Visit: Payer: Self-pay

## 2013-03-14 ENCOUNTER — Encounter (INDEPENDENT_AMBULATORY_CARE_PROVIDER_SITE_OTHER): Payer: Managed Care, Other (non HMO) | Admitting: Surgery

## 2013-03-15 ENCOUNTER — Other Ambulatory Visit: Payer: Self-pay | Admitting: Internal Medicine

## 2013-05-19 ENCOUNTER — Ambulatory Visit (INDEPENDENT_AMBULATORY_CARE_PROVIDER_SITE_OTHER): Payer: Managed Care, Other (non HMO) | Admitting: Internal Medicine

## 2013-05-19 ENCOUNTER — Encounter: Payer: Self-pay | Admitting: Internal Medicine

## 2013-05-19 ENCOUNTER — Ambulatory Visit (INDEPENDENT_AMBULATORY_CARE_PROVIDER_SITE_OTHER)
Admission: RE | Admit: 2013-05-19 | Discharge: 2013-05-19 | Disposition: A | Discharge status: Home / Self Care | Payer: Managed Care, Other (non HMO) | Source: Ambulatory Visit | Attending: Internal Medicine | Admitting: Internal Medicine

## 2013-05-19 VITALS — BP 110/70 | HR 63 | Temp 98.1°F | Resp 16 | Ht 72.0 in | Wt 168.0 lb

## 2013-05-19 DIAGNOSIS — S13101A Dislocation of unspecified cervical vertebrae, initial encounter: Secondary | ICD-10-CM | POA: Insufficient documentation

## 2013-05-19 DIAGNOSIS — M542 Cervicalgia: Secondary | ICD-10-CM

## 2013-05-19 MED ORDER — DICLOFENAC 35 MG PO CAPS: 1.0000 | ORAL_CAPSULE | Freq: Three times a day (TID) | ORAL | Status: DC | PRN

## 2013-05-19 NOTE — Assessment & Plan Note (Signed)
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

## 2013-05-19 NOTE — Progress Notes (Signed)
Pre-visit discussion using our clinic review tool. No additional management support is needed unless otherwise documented below in the visit note.  

## 2013-05-19 NOTE — Patient Instructions (Signed)
Torticollis, Acute °You have suddenly (acutely) developed a twisted neck (torticollis). This is usually a self-limited condition. °CAUSES  °Acute torticollis may be caused by malposition, trauma or infection. Most commonly, acute torticollis is caused by sleeping in an awkward position. Torticollis may also be caused by the flexion, extension or twisting of the neck muscles beyond their normal position. Sometimes, the exact cause may not be known. °SYMPTOMS  °Usually, there is pain and limited movement of the neck. Your neck may twist to one side. °DIAGNOSIS  °The diagnosis is often made by physical examination. X-rays, CT scans or MRIs may be done if there is a history of trauma or concern of infection. °TREATMENT  °For a common, stiff neck that develops during sleep, treatment is focused on relaxing the contracted neck muscle. Medications (including shots) may be used to treat the problem. Most cases resolve in several days. Torticollis usually responds to conservative physical therapy. If left untreated, the shortened and spastic neck muscle can cause deformities in the face and neck. Rarely, surgery is required. °HOME CARE INSTRUCTIONS  °· Use over-the-counter and prescription medications as directed by your caregiver. °· Do stretching exercises and massage the neck as directed by your caregiver. °· Follow up with physical therapy if needed and as directed by your caregiver. °SEEK IMMEDIATE MEDICAL CARE IF:  °· You develop difficulty breathing or noisy breathing (stridor). °· You drool, develop trouble swallowing or have pain with swallowing. °· You develop numbness or weakness in the hands or feet. °· You have changes in speech or vision. °· You have problems with urination or bowel movements. °· You have difficulty walking. °· You have a fever. °· You have increased pain. °MAKE SURE YOU:  °· Understand these instructions. °· Will watch your condition. °· Will get help right away if you are not doing well or  get worse. °Document Released: 04/18/2000 Document Revised: 07/14/2011 Document Reviewed: 05/30/2009 °[employer]® Patient Information ©2014 [employer]. ° °

## 2013-05-19 NOTE — Progress Notes (Signed)
Subjective:    Patient ID: Derek [name], male    DOB: 12/31/1975, 38 y.o.   MRN: [id number]  Neck Pain  This is a new problem. The current episode started in the past 7 days. The problem occurs intermittently. The problem has been unchanged. The pain is associated with nothing. The pain is present in the left side. The quality of the pain is described as stabbing and shooting. The pain is at a severity of 3/10. The pain is mild. The symptoms are aggravated by bending and position. Pertinent negatives include no chest pain, fever, headaches, leg pain, numbness, pain with swallowing, paresis, photophobia, syncope, tingling, trouble swallowing, visual change, weakness or weight loss. He has tried NSAIDs for the symptoms. The treatment provided mild relief.      Review of Systems  Constitutional: Negative.  Negative for fever, chills, weight loss, diaphoresis, appetite change and fatigue.  HENT: Negative.  Negative for trouble swallowing.   Eyes: Negative.  Negative for photophobia.  Respiratory: Negative.  Negative for cough, choking, chest tightness, shortness of breath and wheezing.   Cardiovascular: Negative.  Negative for chest pain, palpitations, leg swelling and syncope.  Gastrointestinal: Negative.  Negative for abdominal pain.  Endocrine: Negative.   Genitourinary: Negative.   Musculoskeletal: Positive for neck pain. Negative for arthralgias, back pain, gait problem, joint swelling, myalgias and neck stiffness.  Skin: Negative.   Allergic/Immunologic: Negative.   Neurological: Negative.  Negative for tingling, weakness, numbness and headaches.  Hematological: Negative.  Negative for adenopathy. Does not bruise/bleed easily.  Psychiatric/Behavioral: Negative.        Objective:   Physical Exam  Vitals reviewed. Constitutional: He is oriented to person, place, and time. He appears well-developed and well-nourished. No distress.  HENT:  Head: Normocephalic and atraumatic.    Eyes: Conjunctivae are normal. Left eye exhibits no discharge. No scleral icterus.  Neck: Normal range of motion. Neck supple. No JVD present. No tracheal deviation present. No thyromegaly present.  Cardiovascular: Normal rate, regular rhythm, normal heart sounds and intact distal pulses.  Exam reveals no gallop and no friction rub.   No murmur heard. Pulmonary/Chest: Effort normal and breath sounds normal. No stridor. No respiratory distress. He has no wheezes. He has no rales. He exhibits no tenderness.  Abdominal: Soft. Bowel sounds are normal. He exhibits no distension and no mass. There is no tenderness. There is no rebound and no guarding.  Musculoskeletal: Normal range of motion. He exhibits no edema and no tenderness.       Cervical back: Normal. He exhibits normal range of motion, no tenderness, no bony tenderness, no swelling, no edema, no deformity, no laceration, no pain, no spasm and normal pulse.  Lymphadenopathy:    He has no cervical adenopathy.  Neurological: He is alert and oriented to person, place, and time. He has normal strength. He displays no atrophy, no tremor and normal reflexes. No cranial nerve deficit or sensory deficit. He exhibits normal muscle tone. He displays a negative Romberg sign. He displays no seizure activity. Coordination and gait normal. He displays no Babinski's sign on the right side. He displays no Babinski's sign on the left side.  Reflex Scores:      Tricep reflexes are 1+ on the right side and 1+ on the left side.      Bicep reflexes are 1+ on the right side and 1+ on the left side.      Brachioradialis reflexes are 1+ on the right side and 1+ on  the left side.      Patellar reflexes are 1+ on the right side and 1+ on the left side.      Achilles reflexes are 1+ on the right side and 1+ on the left side. Skin: Skin is warm and dry. No rash noted. He is not diaphoretic. No erythema. No pallor.  Psychiatric: He has a normal mood and affect. His  behavior is normal. Judgment and thought content normal.     Lab Results  Component Value Date   WBC 5.1 12/05/2008   HGB 14.6 12/05/2008   HCT 43.1 12/05/2008   PLT 146.0* 12/05/2008   GLUCOSE 90 03/28/2008   CHOL 121 03/28/2008   TRIG 36 03/28/2008   HDL 50.1 03/28/2008   LDLCALC 64 03/28/2008   ALT 19 03/28/2008   AST 22 03/28/2008   NA 141 03/28/2008   K 3.9 03/28/2008   CL 104 03/28/2008   CREATININE 0.9 03/28/2008   BUN 7 03/28/2008   CO2 32 03/28/2008   TSH 2.09 03/28/2008       Assessment & Plan:

## 2013-06-27 ENCOUNTER — Encounter: Payer: Self-pay | Admitting: Internal Medicine

## 2013-06-27 ENCOUNTER — Ambulatory Visit (INDEPENDENT_AMBULATORY_CARE_PROVIDER_SITE_OTHER): Payer: Managed Care, Other (non HMO) | Admitting: Internal Medicine

## 2013-06-27 VITALS — BP 120/72 | HR 65 | Temp 98.2°F | Resp 16 | Ht 72.0 in | Wt 163.0 lb

## 2013-06-27 DIAGNOSIS — M542 Cervicalgia: Secondary | ICD-10-CM

## 2013-06-27 DIAGNOSIS — M549 Dorsalgia, unspecified: Secondary | ICD-10-CM

## 2013-06-27 NOTE — Progress Notes (Signed)
Pre visit review using our clinic review tool, if applicable. No additional management support is needed unless otherwise documented below in the visit note. 

## 2013-06-27 NOTE — Patient Instructions (Signed)
Torticollis, Acute °You have suddenly (acutely) developed a twisted neck (torticollis). This is usually a self-limited condition. °CAUSES  °Acute torticollis may be caused by malposition, trauma or infection. Most commonly, acute torticollis is caused by sleeping in an awkward position. Torticollis may also be caused by the flexion, extension or twisting of the neck muscles beyond their normal position. Sometimes, the exact cause may not be known. °SYMPTOMS  °Usually, there is pain and limited movement of the neck. Your neck may twist to one side. °DIAGNOSIS  °The diagnosis is often made by physical examination. X-rays, CT scans or MRIs may be done if there is a history of trauma or concern of infection. °TREATMENT  °For a common, stiff neck that develops during sleep, treatment is focused on relaxing the contracted neck muscle. Medications (including shots) may be used to treat the problem. Most cases resolve in several days. Torticollis usually responds to conservative physical therapy. If left untreated, the shortened and spastic neck muscle can cause deformities in the face and neck. Rarely, surgery is required. °HOME CARE INSTRUCTIONS  °· Use over-the-counter and prescription medications as directed by your caregiver. °· Do stretching exercises and massage the neck as directed by your caregiver. °· Follow up with physical therapy if needed and as directed by your caregiver. °SEEK IMMEDIATE MEDICAL CARE IF:  °· You develop difficulty breathing or noisy breathing (stridor). °· You drool, develop trouble swallowing or have pain with swallowing. °· You develop numbness or weakness in the hands or feet. °· You have changes in speech or vision. °· You have problems with urination or bowel movements. °· You have difficulty walking. °· You have a fever. °· You have increased pain. °MAKE SURE YOU:  °· Understand these instructions. °· Will watch your condition. °· Will get help right away if you are not doing well or  get worse. °Document Released: 04/18/2000 Document Revised: 07/14/2011 Document Reviewed: 05/30/2009 °[employer]® Patient Information ©2014 [employer]. ° °

## 2013-06-27 NOTE — Progress Notes (Signed)
Subjective:    Patient ID: Derek [name], male    DOB: [birth year], 38 y.o.   MRN: [id number]  Back Pain This is a new problem. The current episode started 1 to 4 weeks ago. The problem is unchanged. The pain is present in the lumbar spine. The quality of the pain is described as aching ("feels like a pulled mescle"). The pain does not radiate. The pain is at a severity of 2/10. The pain is mild. The pain is worse during the day. The symptoms are aggravated by position and bending. Pertinent negatives include no abdominal pain, bladder incontinence, bowel incontinence, chest pain, dysuria, fever, headaches, leg pain, numbness, paresis, paresthesias, pelvic pain, perianal numbness, tingling, weakness or weight loss. He has tried nothing for the symptoms.  Neck Pain  This is a recurrent problem. The current episode started more than 1 month ago. The problem occurs intermittently. The problem has been unchanged. The pain is associated with nothing. The pain is present in the left side. The quality of the pain is described as aching. The pain is at a severity of 2/10. The pain is mild. Nothing aggravates the symptoms. The pain is same all the time. Pertinent negatives include no chest pain, fever, headaches, leg pain, numbness, paresis, tingling, weakness or weight loss. He has tried nothing (when I last saw him he has given an NSAID which he took for a week and he got better but the he stopped taking it and now the pain has returned) for the symptoms. The treatment provided no relief.      Review of Systems  Constitutional: Negative.  Negative for fever, chills, weight loss, diaphoresis, appetite change and fatigue.  HENT: Negative.   Eyes: Negative.   Respiratory: Negative.  Negative for cough, choking, chest tightness, shortness of breath, wheezing and stridor.   Cardiovascular: Negative.  Negative for chest pain, palpitations and leg swelling.  Gastrointestinal: Negative.  Negative for  abdominal pain and bowel incontinence.  Endocrine: Negative.   Genitourinary: Negative.  Negative for bladder incontinence, dysuria and pelvic pain.  Musculoskeletal: Positive for back pain and neck pain. Negative for arthralgias, gait problem, joint swelling, myalgias and neck stiffness.  Skin: Negative.   Allergic/Immunologic: Negative.   Neurological: Negative.  Negative for tingling, weakness, numbness, headaches and paresthesias.  Hematological: Negative.  Negative for adenopathy. Does not bruise/bleed easily.  Psychiatric/Behavioral: Negative.        Objective:   Physical Exam  Vitals reviewed. Constitutional: He appears well-developed and well-nourished. No distress.  HENT:  Head: Normocephalic and atraumatic.  Mouth/Throat: Oropharynx is clear and moist. No oropharyngeal exudate.  Eyes: Conjunctivae are normal. Right eye exhibits no discharge. Left eye exhibits no discharge. No scleral icterus.  Neck: Normal range of motion. Neck supple. No JVD present. No tracheal deviation present. No thyromegaly present.  Cardiovascular: Normal rate, regular rhythm, normal heart sounds and intact distal pulses.  Exam reveals no gallop and no friction rub.   No murmur heard. Pulmonary/Chest: Effort normal and breath sounds normal. No stridor. No respiratory distress. He has no wheezes. He has no rales. He exhibits no tenderness.  Abdominal: Soft. Bowel sounds are normal. He exhibits no distension and no mass. There is no tenderness. There is no rebound and no guarding.  Musculoskeletal: Normal range of motion. He exhibits no edema and no tenderness.       Cervical back: Normal. He exhibits normal range of motion, no tenderness, no bony tenderness, no swelling, no edema, no deformity,  no laceration, no pain, no spasm and normal pulse.       Lumbar back: Normal. He exhibits normal range of motion, no tenderness, no bony tenderness, no swelling, no edema, no deformity, no laceration, no pain, no  spasm and normal pulse.  Lymphadenopathy:    He has no cervical adenopathy.  Neurological: He is alert. He has normal strength. He displays no atrophy, no tremor and normal reflexes. No cranial nerve deficit or sensory deficit. He exhibits normal muscle tone. He displays a negative Romberg sign. He displays no seizure activity. Coordination and gait normal.  Reflex Scores:      Tricep reflexes are 1+ on the right side and 1+ on the left side.      Bicep reflexes are 1+ on the right side and 1+ on the left side.      Brachioradialis reflexes are 1+ on the right side and 1+ on the left side.      Patellar reflexes are 1+ on the right side and 1+ on the left side.      Achilles reflexes are 1+ on the right side and 1+ on the left side. Neg SLR in BLE  Skin: Skin is warm and dry. No rash noted. He is not diaphoretic. No erythema. No pallor.          Assessment & Plan:

## 2013-06-28 DIAGNOSIS — M545 Low back pain, unspecified: Secondary | ICD-10-CM | POA: Insufficient documentation

## 2013-06-28 NOTE — Assessment & Plan Note (Signed)
Symptoms are benign, xray was normal He had responded well to nsaids so he will restart that He is not willing to an MRI at this to see if there is a herniated disc

## 2013-06-28 NOTE — Assessment & Plan Note (Signed)
Symptoms are benign Exam is normal He will restart the nsaids

## 2013-07-14 ENCOUNTER — Encounter: Payer: Self-pay | Admitting: Internal Medicine

## 2013-07-14 ENCOUNTER — Ambulatory Visit (INDEPENDENT_AMBULATORY_CARE_PROVIDER_SITE_OTHER): Payer: Managed Care, Other (non HMO) | Admitting: Internal Medicine

## 2013-07-14 VITALS — BP 120/80 | HR 68 | Temp 98.4°F | Resp 16 | Ht 72.0 in | Wt 165.1 lb

## 2013-07-14 DIAGNOSIS — M542 Cervicalgia: Secondary | ICD-10-CM

## 2013-07-14 NOTE — Progress Notes (Signed)
Pre visit review using our clinic review tool, if applicable. No additional management support is needed unless otherwise documented below in the visit note. 

## 2013-07-14 NOTE — Progress Notes (Signed)
Subjective:    Patient ID: Derek [name], male    DOB: 1976-03-31, 38 y.o.   MRN: [id number]  Neck Pain  This is a recurrent problem. The current episode started more than 1 month ago. The problem occurs intermittently. The problem has been gradually improving. The pain is associated with nothing. The pain is present in the midline. The quality of the pain is described as aching. The pain is at a severity of 1/10. The pain is mild. Nothing aggravates the symptoms. Pertinent negatives include no chest pain, fever, headaches, leg pain, numbness, pain with swallowing, paresis, photophobia, syncope, tingling, trouble swallowing, visual change, weakness or weight loss. He has tried NSAIDs for the symptoms. The treatment provided significant relief.      Review of Systems  Constitutional: Negative for fever and weight loss.  HENT: Negative.  Negative for trouble swallowing.   Eyes: Negative for photophobia.  Respiratory: Negative.   Cardiovascular: Negative.  Negative for chest pain and syncope.  Gastrointestinal: Negative.   Genitourinary: Negative.   Musculoskeletal: Positive for neck pain. Negative for arthralgias, back pain and neck stiffness.  Neurological: Negative for dizziness, tingling, tremors, weakness, numbness and headaches.  Hematological: Negative.  Negative for adenopathy. Does not bruise/bleed easily.  Psychiatric/Behavioral: Negative.   All other systems reviewed and are negative.       Objective:   Physical Exam  Vitals reviewed. Constitutional: He is oriented to person, place, and time. He appears well-developed and well-nourished. No distress.  HENT:  Head: Normocephalic and atraumatic.  Mouth/Throat: Oropharynx is clear and moist. No oropharyngeal exudate.  Eyes: Conjunctivae are normal. Right eye exhibits no discharge. Left eye exhibits no discharge. No scleral icterus.  Neck: Normal range of motion. Neck supple. No JVD present. No tracheal deviation present.  No thyromegaly present.  Cardiovascular: Normal rate, regular rhythm, normal heart sounds and intact distal pulses.  Exam reveals no gallop and no friction rub.   No murmur heard. Pulmonary/Chest: Effort normal and breath sounds normal. No stridor. No respiratory distress. He has no wheezes. He has no rales. He exhibits no tenderness.  Musculoskeletal: Normal range of motion. He exhibits no edema and no tenderness.       Cervical back: Normal. He exhibits normal range of motion, no tenderness, no bony tenderness, no swelling, no edema, no deformity, no laceration, no pain, no spasm and normal pulse.  Lymphadenopathy:    He has no cervical adenopathy.  Neurological: He is alert and oriented to person, place, and time. He has normal strength. He displays no atrophy, no tremor and normal reflexes. No cranial nerve deficit or sensory deficit. He exhibits normal muscle tone. He displays a negative Romberg sign. He displays no seizure activity. Coordination and gait normal.  Reflex Scores:      Tricep reflexes are 1+ on the right side and 1+ on the left side.      Bicep reflexes are 1+ on the right side and 1+ on the left side.      Brachioradialis reflexes are 1+ on the right side and 1+ on the left side.      Patellar reflexes are 1+ on the right side and 1+ on the left side.      Achilles reflexes are 1+ on the right side and 1+ on the left side. Skin: Skin is warm and dry. No rash noted. He is not diaphoretic. No erythema. No pallor.  Psychiatric: He has a normal mood and affect. His behavior is normal. Judgment  and thought content normal.          Assessment & Plan:

## 2013-07-14 NOTE — Assessment & Plan Note (Signed)
Improvement noted He will cont nsaids as needed

## 2013-07-14 NOTE — Patient Instructions (Signed)
Torticollis, Acute °You have suddenly (acutely) developed a twisted neck (torticollis). This is usually a self-limited condition. °CAUSES  °Acute torticollis may be caused by malposition, trauma or infection. Most commonly, acute torticollis is caused by sleeping in an awkward position. Torticollis may also be caused by the flexion, extension or twisting of the neck muscles beyond their normal position. Sometimes, the exact cause may not be known. °SYMPTOMS  °Usually, there is pain and limited movement of the neck. Your neck may twist to one side. °DIAGNOSIS  °The diagnosis is often made by physical examination. X-rays, CT scans or MRIs may be done if there is a history of trauma or concern of infection. °TREATMENT  °For a common, stiff neck that develops during sleep, treatment is focused on relaxing the contracted neck muscle. Medications (including shots) may be used to treat the problem. Most cases resolve in several days. Torticollis usually responds to conservative physical therapy. If left untreated, the shortened and spastic neck muscle can cause deformities in the face and neck. Rarely, surgery is required. °HOME CARE INSTRUCTIONS  °· Use over-the-counter and prescription medications as directed by your caregiver. °· Do stretching exercises and massage the neck as directed by your caregiver. °· Follow up with physical therapy if needed and as directed by your caregiver. °SEEK IMMEDIATE MEDICAL CARE IF:  °· You develop difficulty breathing or noisy breathing (stridor). °· You drool, develop trouble swallowing or have pain with swallowing. °· You develop numbness or weakness in the hands or feet. °· You have changes in speech or vision. °· You have problems with urination or bowel movements. °· You have difficulty walking. °· You have a fever. °· You have increased pain. °MAKE SURE YOU:  °· Understand these instructions. °· Will watch your condition. °· Will get help right away if you are not doing well or  get worse. °Document Released: 04/18/2000 Document Revised: 07/14/2011 Document Reviewed: 05/30/2009 °[employer]® Patient Information ©2014 [employer]. ° °

## 2013-07-18 ENCOUNTER — Ambulatory Visit: Payer: Managed Care, Other (non HMO) | Admitting: Internal Medicine

## 2013-08-29 ENCOUNTER — Ambulatory Visit (INDEPENDENT_AMBULATORY_CARE_PROVIDER_SITE_OTHER): Payer: Managed Care, Other (non HMO) | Admitting: Internal Medicine

## 2013-08-29 ENCOUNTER — Other Ambulatory Visit (INDEPENDENT_AMBULATORY_CARE_PROVIDER_SITE_OTHER): Payer: Managed Care, Other (non HMO)

## 2013-08-29 ENCOUNTER — Encounter: Payer: Self-pay | Admitting: Internal Medicine

## 2013-08-29 VITALS — BP 128/80 | HR 57 | Temp 97.0°F | Resp 16 | Ht 72.0 in | Wt 160.8 lb

## 2013-08-29 DIAGNOSIS — M542 Cervicalgia: Secondary | ICD-10-CM

## 2013-08-29 DIAGNOSIS — Z0000 Encounter for general adult medical examination without abnormal findings: Secondary | ICD-10-CM | POA: Insufficient documentation

## 2013-08-29 LAB — COMPREHENSIVE METABOLIC PANEL
ALBUMIN: 4.6 g/dL (ref 3.5–5.2)
ALT: 18 U/L (ref 0–53)
AST: 25 U/L (ref 0–37)
Alkaline Phosphatase: 66 U/L (ref 39–117)
BUN: 8 mg/dL (ref 6–23)
CO2: 27 meq/L (ref 19–32)
Calcium: 9.4 mg/dL (ref 8.4–10.5)
Chloride: 105 mEq/L (ref 96–112)
Creatinine, Ser: 0.9 mg/dL (ref 0.4–1.5)
GFR: 108.13 mL/min (ref >60.00)
GLUCOSE: 89 mg/dL (ref 70–99)
POTASSIUM: 5.1 meq/L (ref 3.5–5.1)
SODIUM: 139 meq/L (ref 135–145)
TOTAL PROTEIN: 6.9 g/dL (ref 6.0–8.3)
Total Bilirubin: 0.9 mg/dL (ref 0.3–1.2)

## 2013-08-29 LAB — CBC WITH DIFFERENTIAL/PLATELET
BASOS ABS: 0 10*3/uL (ref 0.0–0.1)
Basophils Relative: 0.7 % (ref 0.0–3.0)
EOS PCT: 5.9 % — AB (ref 0.0–5.0)
Eosinophils Absolute: 0.4 10*3/uL (ref 0.0–0.7)
HCT: 42.5 % (ref 39.0–52.0)
Hemoglobin: 14.3 g/dL (ref 13.0–17.0)
Lymphocytes Relative: 35.2 % (ref 12.0–46.0)
Lymphs Abs: 2.2 10*3/uL (ref 0.7–4.0)
MCHC: 33.7 g/dL (ref 30.0–36.0)
MCV: 88.2 fl (ref 78.0–100.0)
MONOS PCT: 10.4 % (ref 3.0–12.0)
Monocytes Absolute: 0.7 10*3/uL (ref 0.1–1.0)
NEUTROS PCT: 47.8 % (ref 43.0–77.0)
Neutro Abs: 3 10*3/uL (ref 1.4–7.7)
PLATELETS: 173 10*3/uL (ref 150.0–400.0)
RBC: 4.82 Mil/uL (ref 4.22–5.81)
RDW: 13.5 % (ref 11.5–14.6)
WBC: 6.3 10*3/uL (ref 4.5–10.5)

## 2013-08-29 LAB — LIPID PANEL
Cholesterol: 122 mg/dL (ref 0–200)
HDL: 59 mg/dL (ref >39.00)
LDL Cholesterol: 59 mg/dL (ref 0–99)
Total CHOL/HDL Ratio: 2
Triglycerides: 21 mg/dL (ref 0.0–149.0)
VLDL: 4.2 mg/dL (ref 0.0–40.0)

## 2013-08-29 LAB — TSH: TSH: 1.85 u[IU]/mL (ref 0.35–5.50)

## 2013-08-29 LAB — SEDIMENTATION RATE: Sed Rate: 0 mm/hr (ref 0–22)

## 2013-08-29 NOTE — Progress Notes (Signed)
Subjective:    Patient ID: Derek [name], male    DOB: [birth year], 38 y.o.   MRN: [id number]  HPI Comments: He returns complaining of persistent discomfort in his neck though there has been some improvement.     Review of Systems  Constitutional: Negative.  Negative for fever, chills, diaphoresis, appetite change and fatigue.  HENT: Negative.   Eyes: Negative.   Respiratory: Negative.  Negative for apnea, cough, choking, chest tightness, shortness of breath, wheezing and stridor.   Cardiovascular: Negative.  Negative for chest pain, palpitations and leg swelling.  Gastrointestinal: Negative.  Negative for nausea, vomiting, abdominal pain, diarrhea, constipation and blood in stool.  Endocrine: Negative.   Genitourinary: Negative.   Musculoskeletal: Positive for neck pain ("pressure and aching over the top of his neck"). Negative for arthralgias, back pain, gait problem, joint swelling, myalgias and neck stiffness.  Skin: Negative.   Allergic/Immunologic: Negative.   Neurological: Positive for light-headedness. Negative for dizziness, tremors, seizures, syncope, facial asymmetry, speech difficulty, weakness, numbness and headaches.  Hematological: Negative.  Negative for adenopathy. Does not bruise/bleed easily.  Psychiatric/Behavioral: Negative.        Objective:   Physical Exam  Vitals reviewed. Constitutional: He is oriented to person, place, and time. He appears well-developed and well-nourished. No distress.  HENT:  Head: Normocephalic and atraumatic.  Mouth/Throat: Oropharynx is clear and moist. No oropharyngeal exudate.  Eyes: Conjunctivae are normal. Right eye exhibits no discharge. Left eye exhibits no discharge. No scleral icterus.  Neck: Trachea normal, normal range of motion, full passive range of motion without pain and phonation normal. Neck supple. Normal carotid pulses, no hepatojugular reflux and no JVD present. No tracheal tenderness, no spinous process  tenderness and no muscular tenderness present. Carotid bruit is not present. No rigidity. No tracheal deviation, no edema, no erythema and normal range of motion present. Kernig's sign noted. No Brudzinski's sign noted. No mass and no thyromegaly present.  Cardiovascular: Normal rate, regular rhythm, normal heart sounds and intact distal pulses.  Exam reveals no gallop and no friction rub.   No murmur heard. Pulmonary/Chest: Effort normal and breath sounds normal. No stridor. No respiratory distress. He has no wheezes. He has no rales. He exhibits no tenderness.  Abdominal: Soft. Bowel sounds are normal. He exhibits no distension and no mass. There is no tenderness. There is no rebound and no guarding.  Musculoskeletal: Normal range of motion. He exhibits no edema and no tenderness.       Cervical back: Normal. He exhibits normal range of motion, no tenderness, no bony tenderness, no swelling, no edema, no deformity, no laceration, no pain, no spasm and normal pulse.  Lymphadenopathy:    He has no cervical adenopathy.  Neurological: He is alert and oriented to person, place, and time. He has normal strength. He displays no atrophy, no tremor and normal reflexes. No cranial nerve deficit or sensory deficit. He exhibits normal muscle tone. He displays a negative Romberg sign. He displays no seizure activity. Coordination and gait normal.  Reflex Scores:      Tricep reflexes are 1+ on the right side and 1+ on the left side.      Bicep reflexes are 1+ on the right side and 1+ on the left side.      Brachioradialis reflexes are 1+ on the right side and 1+ on the left side.      Patellar reflexes are 2+ on the right side and 2+ on the left side.  Achilles reflexes are 2+ on the right side and 2+ on the left side. Skin: Skin is warm and dry. No rash noted. He is not diaphoretic. No erythema. No pallor.  Psychiatric: He has a normal mood and affect. His behavior is normal. Judgment and thought content  normal.     Lab Results  Component Value Date   WBC 5.1 12/05/2008   HGB 14.6 12/05/2008   HCT 43.1 12/05/2008   PLT 146.0* 12/05/2008   GLUCOSE 90 03/28/2008   CHOL 121 03/28/2008   TRIG 36 03/28/2008   HDL 50.1 03/28/2008   LDLCALC 64 03/28/2008   ALT 19 03/28/2008   AST 22 03/28/2008   NA 141 03/28/2008   K 3.9 03/28/2008   CL 104 03/28/2008   CREATININE 0.9 03/28/2008   BUN 7 03/28/2008   CO2 32 03/28/2008   TSH 2.09 03/28/2008       Assessment & Plan:

## 2013-08-29 NOTE — Assessment & Plan Note (Signed)
I have ordered an MRI to look for a spinal cord lesion, disc herniation, nerve impingement I will also check his labs to look for inflammation, infection, and organic causes of discomfort

## 2013-08-29 NOTE — Progress Notes (Signed)
Pre visit review using our clinic review tool, if applicable. No additional management support is needed unless otherwise documented below in the visit note. 

## 2013-08-29 NOTE — Patient Instructions (Signed)

## 2013-08-29 NOTE — Assessment & Plan Note (Signed)
Exam done Labs ordered Vaccines were reviewed Pt ed material was given 

## 2013-09-03 ENCOUNTER — Ambulatory Visit
Admission: RE | Admit: 2013-09-03 | Discharge: 2013-09-03 | Disposition: A | Discharge status: Home / Self Care | Payer: Managed Care, Other (non HMO) | Source: Ambulatory Visit | Attending: Internal Medicine | Admitting: Internal Medicine

## 2013-09-03 DIAGNOSIS — M542 Cervicalgia: Secondary | ICD-10-CM

## 2013-09-04 ENCOUNTER — Encounter: Payer: Self-pay | Admitting: Internal Medicine

## 2013-09-14 ENCOUNTER — Ambulatory Visit (INDEPENDENT_AMBULATORY_CARE_PROVIDER_SITE_OTHER): Payer: Managed Care, Other (non HMO) | Admitting: Internal Medicine

## 2013-09-14 ENCOUNTER — Encounter: Payer: Self-pay | Admitting: Internal Medicine

## 2013-09-14 VITALS — BP 118/70 | HR 68 | Temp 98.1°F | Resp 16 | Ht 72.0 in | Wt 164.5 lb

## 2013-09-14 DIAGNOSIS — M5020 Other cervical disc displacement, unspecified cervical region: Secondary | ICD-10-CM

## 2013-09-14 DIAGNOSIS — S13101A Dislocation of unspecified cervical vertebrae, initial encounter: Secondary | ICD-10-CM

## 2013-09-14 NOTE — Assessment & Plan Note (Signed)
This is improving He will cont nsaids as needed He will start exercises at home - he defers on starting PT

## 2013-09-14 NOTE — Progress Notes (Signed)
Subjective:    Patient ID: Derek [name], male    DOB: 12-24-1975, 38 y.o.   MRN: [id number]  Neck Pain  This is a recurrent problem. The current episode started more than 1 month ago. The problem occurs intermittently. The problem has been gradually improving. The pain is associated with nothing. The pain is present in the left side and midline. The quality of the pain is described as aching. The pain is at a severity of 2/10. The pain is mild. Nothing aggravates the symptoms. Pertinent negatives include no chest pain, fever, headaches, leg pain, numbness, pain with swallowing, paresis, photophobia, syncope, tingling, trouble swallowing, visual change, weakness or weight loss. He has tried NSAIDs for the symptoms. The treatment provided significant relief.      Review of Systems  Constitutional: Negative.  Negative for fever, chills, weight loss, diaphoresis, appetite change and fatigue.  HENT: Negative.  Negative for trouble swallowing.   Eyes: Negative.  Negative for photophobia.  Respiratory: Negative.  Negative for cough, choking, chest tightness, shortness of breath and stridor.   Cardiovascular: Negative.  Negative for chest pain, palpitations, leg swelling and syncope.  Gastrointestinal: Negative.  Negative for nausea, vomiting, abdominal pain, diarrhea, constipation and blood in stool.  Endocrine: Negative.   Genitourinary: Negative.   Musculoskeletal: Positive for neck pain. Negative for arthralgias, back pain, gait problem, joint swelling, myalgias and neck stiffness.  Skin: Negative.   Allergic/Immunologic: Negative.   Neurological: Negative.  Negative for tingling, weakness, numbness and headaches.  Hematological: Negative.  Negative for adenopathy. Does not bruise/bleed easily.  Psychiatric/Behavioral: Negative.        Objective:   Physical Exam  Vitals reviewed. Constitutional: He is oriented to person, place, and time. He appears well-developed and well-nourished.  No distress.  HENT:  Head: Normocephalic and atraumatic.  Mouth/Throat: Oropharynx is clear and moist. No oropharyngeal exudate.  Eyes: Conjunctivae are normal. Right eye exhibits no discharge. Left eye exhibits no discharge. No scleral icterus.  Neck: Normal range of motion. Neck supple. No JVD present. No tracheal deviation present. No thyromegaly present.  Cardiovascular: Normal rate, regular rhythm, normal heart sounds and intact distal pulses.  Exam reveals no gallop and no friction rub.   No murmur heard. Pulmonary/Chest: Effort normal and breath sounds normal. No stridor. No respiratory distress. He has no wheezes. He has no rales. He exhibits no tenderness.  Abdominal: Soft. Bowel sounds are normal. He exhibits no distension and no mass. There is no tenderness. There is no rebound and no guarding.  Musculoskeletal: Normal range of motion. He exhibits no edema and no tenderness.       Cervical back: Normal. He exhibits normal range of motion, no tenderness, no bony tenderness, no swelling, no edema, no deformity, no laceration, no pain, no spasm and normal pulse.  Lymphadenopathy:    He has no cervical adenopathy.  Neurological: He is oriented to person, place, and time.  Skin: Skin is warm and dry. No rash noted. He is not diaphoretic. No erythema. No pallor.  Psychiatric: He has a normal mood and affect. His behavior is normal. Judgment and thought content normal.      Lab Results  Component Value Date   WBC 6.3 08/29/2013   HGB 14.3 08/29/2013   HCT 42.5 08/29/2013   PLT 173.0 08/29/2013   GLUCOSE 89 08/29/2013   CHOL 122 08/29/2013   TRIG 21.0 08/29/2013   HDL 59.00 08/29/2013   LDLCALC 59 08/29/2013   ALT 18 08/29/2013  AST 25 08/29/2013   NA 139 08/29/2013   K 5.1 08/29/2013   CL 105 08/29/2013   CREATININE 0.9 08/29/2013   BUN 8 08/29/2013   CO2 27 08/29/2013   TSH 1.85 08/29/2013      Assessment & Plan:

## 2013-09-14 NOTE — Progress Notes (Signed)
Pre visit review using our clinic review tool, if applicable. No additional management support is needed unless otherwise documented below in the visit note. 

## 2013-09-14 NOTE — Patient Instructions (Signed)
Cervical Sprain °A cervical sprain is when the tissues (ligaments) that hold the neck bones in place stretch or tear. °HOME CARE  °· Put ice on the injured area. °· Put ice in a plastic bag. °· Place a towel between your skin and the bag. °· Leave the ice on for 15 20 minutes, 3 4 times a day. °· You may have been given a collar to wear. This collar keeps your neck from moving while you heal. °· Do not take the collar off unless told by your doctor. °· If you have long hair, keep it outside of the collar. °· Ask your doctor before changing the position of your collar. You may need to change its position over time to make it more comfortable. °· If you are allowed to take off the collar for cleaning or bathing, follow your doctor's instructions on how to do it safely. °· Keep your collar clean by wiping it with mild soap and water. Dry it completely. If the collar has removable pads, remove them every 1 2 days to hand wash them with soap and water. Allow them to air dry. They should be dry before you wear them in the collar. °· Do not drive while wearing the collar. °· Only take medicine as told by your doctor. °· Keep all doctor visits as told. °· Keep all physical therapy visits as told. °· Adjust your work station so that you have good posture while you work. °· Avoid positions and activities that make your problems worse. °· Warm up and stretch before being active. °GET HELP IF: °· Your pain is not controlled with medicine. °· You cannot take less pain medicine over time as planned. °· Your activity level does not improve as expected. °GET HELP RIGHT AWAY IF:  °· You are bleeding. °· Your stomach is upset. °· You have an allergic reaction to your medicine. °· You develop new problems that you cannot explain. °· You lose feeling (become numb) or you cannot move any part of your body (paralysis). °· You have tingling or weakness in any part of your body. °· Your symptoms get worse. Symptoms include: °· Pain,  soreness, stiffness, puffiness (swelling), or a burning feeling in your neck. °· Pain when your neck is touched. °· Shoulder or upper back pain. °· Limited ability to move your neck. °· Headache. °· Dizziness. °· Your hands or arms feel week, lose feeling, or tingle. °· Muscle spasms. °· Difficulty swallowing or chewing. °MAKE SURE YOU:  °· Understand these instructions. °· Will watch your condition. °· Will get help right away if you are not doing well or get worse. °Document Released: 10/08/2007 Document Revised: 12/22/2012 Document Reviewed: 10/27/2012 °[employer]® Patient Information ©2014 [employer]. ° °

## 2013-11-08 ENCOUNTER — Other Ambulatory Visit: Payer: Self-pay | Admitting: Internal Medicine

## 2013-11-08 ENCOUNTER — Telehealth: Payer: Self-pay | Admitting: Internal Medicine

## 2013-11-08 DIAGNOSIS — S13101A Dislocation of unspecified cervical vertebrae, initial encounter: Secondary | ICD-10-CM

## 2013-11-08 NOTE — Telephone Encounter (Signed)
done

## 2013-11-08 NOTE — Telephone Encounter (Signed)
Patient called to notify Dr. Yetta BarreJones that he would like to proceed with physical therapy for his neck. States that he is still having issues with pain in his neck.

## 2013-11-21 ENCOUNTER — Ambulatory Visit: Payer: Managed Care, Other (non HMO) | Attending: Internal Medicine | Admitting: Physical Therapy

## 2013-11-21 DIAGNOSIS — M542 Cervicalgia: Secondary | ICD-10-CM | POA: Diagnosis not present

## 2013-11-21 DIAGNOSIS — IMO0001 Reserved for inherently not codable concepts without codable children: Secondary | ICD-10-CM | POA: Diagnosis not present

## 2013-11-21 DIAGNOSIS — M5020 Other cervical disc displacement, unspecified cervical region: Secondary | ICD-10-CM | POA: Diagnosis not present

## 2013-12-12 ENCOUNTER — Ambulatory Visit: Payer: Managed Care, Other (non HMO) | Attending: Internal Medicine | Admitting: Physical Therapy

## 2013-12-12 DIAGNOSIS — IMO0001 Reserved for inherently not codable concepts without codable children: Secondary | ICD-10-CM | POA: Diagnosis not present

## 2013-12-19 ENCOUNTER — Ambulatory Visit: Payer: Managed Care, Other (non HMO) | Admitting: Physical Therapy

## 2013-12-19 DIAGNOSIS — IMO0001 Reserved for inherently not codable concepts without codable children: Secondary | ICD-10-CM | POA: Diagnosis not present

## 2013-12-26 ENCOUNTER — Ambulatory Visit: Payer: Managed Care, Other (non HMO) | Admitting: Physical Therapy

## 2013-12-26 DIAGNOSIS — IMO0001 Reserved for inherently not codable concepts without codable children: Secondary | ICD-10-CM | POA: Diagnosis not present

## 2014-01-02 ENCOUNTER — Ambulatory Visit: Payer: Managed Care, Other (non HMO) | Admitting: Physical Therapy

## 2014-01-03 ENCOUNTER — Ambulatory Visit (INDEPENDENT_AMBULATORY_CARE_PROVIDER_SITE_OTHER): Payer: Managed Care, Other (non HMO) | Admitting: Internal Medicine

## 2014-01-03 ENCOUNTER — Encounter: Payer: Self-pay | Admitting: Internal Medicine

## 2014-01-03 ENCOUNTER — Other Ambulatory Visit: Payer: Self-pay | Admitting: Internal Medicine

## 2014-01-03 ENCOUNTER — Ambulatory Visit (INDEPENDENT_AMBULATORY_CARE_PROVIDER_SITE_OTHER)
Admission: RE | Admit: 2014-01-03 | Discharge: 2014-01-03 | Disposition: A | Discharge status: Home / Self Care | Payer: Managed Care, Other (non HMO) | Source: Ambulatory Visit | Attending: Internal Medicine | Admitting: Internal Medicine

## 2014-01-03 VITALS — BP 112/82 | HR 72 | Temp 98.9°F | Resp 16 | Wt 152.0 lb

## 2014-01-03 DIAGNOSIS — J069 Acute upper respiratory infection, unspecified: Secondary | ICD-10-CM

## 2014-01-03 DIAGNOSIS — J189 Pneumonia, unspecified organism: Secondary | ICD-10-CM | POA: Insufficient documentation

## 2014-01-03 MED ORDER — LEVOFLOXACIN 500 MG PO TABS: 500.0000 mg | ORAL_TABLET | Freq: Every day | ORAL | Status: DC

## 2014-01-03 NOTE — Assessment & Plan Note (Signed)
CXR - [facility] Levaquin Rx emailed F/u CXR

## 2014-01-03 NOTE — Progress Notes (Signed)
   Subjective:    URI  This is a new problem. The current episode started in the past 7 days. The problem has been gradually improving. The maximum temperature recorded prior to his arrival was 102 - 102.9 F. The fever has been present for 1 to 2 days. Associated symptoms include coughing (mild) and headaches. Pertinent negatives include no diarrhea, rash, rhinorrhea, sinus pain, sneezing, sore throat, vomiting or wheezing. He has tried acetaminophen for the symptoms. The treatment provided mild relief.      Review of Systems  Constitutional: Positive for fever, chills and fatigue. Negative for unexpected weight change.  HENT: Negative for hearing loss, postnasal drip, rhinorrhea, sinus pressure, sneezing and sore throat.   Respiratory: Positive for cough (mild). Negative for wheezing.   Gastrointestinal: Negative for vomiting and diarrhea.  Musculoskeletal: Positive for myalgias.  Skin: Negative for rash.  Neurological: Positive for headaches. Negative for dizziness and tremors.  Psychiatric/Behavioral: Negative for sleep disturbance.       Objective:   Physical Exam  Constitutional: He is oriented to person, place, and time. He appears well-developed. No distress.  NAD  HENT:  Mouth/Throat: Oropharynx is clear and moist.  eryth throat  Eyes: Conjunctivae are normal. Pupils are equal, round, and reactive to light.  Neck: Normal range of motion. No JVD present. No thyromegaly present.  Cardiovascular: Normal rate, regular rhythm, normal heart sounds and intact distal pulses.  Exam reveals no gallop and no friction rub.   No murmur heard. Pulmonary/Chest: Effort normal and breath sounds normal. No respiratory distress. He has no wheezes. He has no rales. He exhibits no tenderness.  Abdominal: Soft. Bowel sounds are normal. He exhibits no distension and no mass. There is no tenderness. There is no rebound and no guarding.  Musculoskeletal: Normal range of motion. He exhibits no edema  and no tenderness.  Lymphadenopathy:    He has no cervical adenopathy.  Neurological: He is alert and oriented to person, place, and time. He has normal reflexes. No cranial nerve deficit. He exhibits normal muscle tone. He displays a negative Romberg sign. Coordination and gait normal.  No meningeal signs  Skin: Skin is warm and dry. No rash noted. No pallor.  Psychiatric: He has a normal mood and affect. His behavior is normal. Judgment and thought content normal.   ?crackles R base       Assessment & Plan:

## 2014-01-03 NOTE — Patient Instructions (Signed)

## 2014-01-03 NOTE — Progress Notes (Deleted)
Pre visit review using our clinic review tool, if applicable. No additional management support is needed unless otherwise documented below in the visit note. 

## 2014-01-03 NOTE — Assessment & Plan Note (Signed)
8/15 Viral syndrome w/high fever CXR OTC meds

## 2014-01-16 ENCOUNTER — Encounter: Payer: Managed Care, Other (non HMO) | Admitting: Physical Therapy

## 2014-01-20 ENCOUNTER — Ambulatory Visit (INDEPENDENT_AMBULATORY_CARE_PROVIDER_SITE_OTHER)
Admission: RE | Admit: 2014-01-20 | Discharge: 2014-01-20 | Disposition: A | Discharge status: Home / Self Care | Payer: Managed Care, Other (non HMO) | Source: Ambulatory Visit | Attending: Internal Medicine | Admitting: Internal Medicine

## 2014-01-20 ENCOUNTER — Encounter: Payer: Self-pay | Admitting: Internal Medicine

## 2014-01-20 ENCOUNTER — Ambulatory Visit (INDEPENDENT_AMBULATORY_CARE_PROVIDER_SITE_OTHER): Payer: Managed Care, Other (non HMO) | Admitting: Internal Medicine

## 2014-01-20 VITALS — BP 118/70 | HR 78 | Temp 98.2°F | Resp 16 | Ht 72.0 in | Wt 154.0 lb

## 2014-01-20 DIAGNOSIS — J189 Pneumonia, unspecified organism: Secondary | ICD-10-CM

## 2014-01-20 NOTE — Progress Notes (Signed)
   Subjective:    Patient ID: Derek [name], male    DOB: [birth year], 38 y.o.   MRN: [id number]  Pneumonia There is no chest tightness, cough, difficulty breathing, frequent throat clearing, hemoptysis, hoarse voice, shortness of breath, sputum production or wheezing. The problem has been resolved. Pertinent negatives include no appetite change, chest pain, dyspnea on exertion, ear congestion, fever, headaches, malaise/fatigue, myalgias, nasal congestion, postnasal drip, sore throat, sweats, trouble swallowing or weight loss.      Review of Systems  Constitutional: Negative.  Negative for fever, chills, weight loss, malaise/fatigue, diaphoresis, appetite change and fatigue.  HENT: Negative.  Negative for hoarse voice, postnasal drip, sore throat, trouble swallowing and voice change.   Eyes: Negative.   Respiratory: Negative.  Negative for cough, hemoptysis, sputum production, choking, chest tightness, shortness of breath and wheezing.   Cardiovascular: Negative.  Negative for chest pain, dyspnea on exertion, palpitations and leg swelling.  Gastrointestinal: Negative.  Negative for nausea, vomiting, abdominal pain, diarrhea and constipation.  Endocrine: Negative.   Genitourinary: Negative.   Musculoskeletal: Negative.  Negative for myalgias.  Skin: Negative.  Negative for rash.  Allergic/Immunologic: Negative.   Neurological: Negative.  Negative for headaches.  Hematological: Negative.  Negative for adenopathy. Does not bruise/bleed easily.  Psychiatric/Behavioral: Negative.        Objective:   Physical Exam  Vitals reviewed. Constitutional: He is oriented to person, place, and time. He appears well-developed and well-nourished.  Non-toxic appearance. He does not have a sickly appearance. He does not appear ill. No distress.  HENT:  Head: Normocephalic and atraumatic.  Mouth/Throat: Oropharynx is clear and moist. No oropharyngeal exudate.  Eyes: Conjunctivae are normal. Right eye  exhibits no discharge. Left eye exhibits no discharge. No scleral icterus.  Neck: Normal range of motion. Neck supple. No JVD present. No tracheal deviation present. No thyromegaly present.  Cardiovascular: Normal rate, regular rhythm, normal heart sounds and intact distal pulses.  Exam reveals no gallop and no friction rub.   No murmur heard. Pulmonary/Chest: Effort normal and breath sounds normal. No stridor. No respiratory distress. He has no wheezes. He has no rales. He exhibits no tenderness.  Abdominal: Soft. Bowel sounds are normal. He exhibits no distension and no mass. There is no tenderness. There is no rebound and no guarding.  Musculoskeletal: Normal range of motion. He exhibits no edema and no tenderness.  Lymphadenopathy:    He has no cervical adenopathy.  Neurological: He is oriented to person, place, and time.  Skin: Skin is warm and dry. No rash noted. He is not diaphoretic. No erythema. No pallor.          Assessment & Plan:

## 2014-01-20 NOTE — Progress Notes (Signed)
Pre visit review using our clinic review tool, if applicable. No additional management support is needed unless otherwise documented below in the visit note. 

## 2014-01-20 NOTE — Assessment & Plan Note (Signed)
Clinically he has improved I will recheck his CXR today

## 2014-01-20 NOTE — Patient Instructions (Signed)

## 2014-01-23 ENCOUNTER — Encounter: Payer: Self-pay | Admitting: Internal Medicine

## 2014-01-27 ENCOUNTER — Encounter: Payer: Managed Care, Other (non HMO) | Admitting: Physical Therapy

## 2014-10-09 ENCOUNTER — Ambulatory Visit (INDEPENDENT_AMBULATORY_CARE_PROVIDER_SITE_OTHER): Payer: Managed Care, Other (non HMO) | Admitting: Internal Medicine

## 2014-10-09 ENCOUNTER — Encounter: Payer: Self-pay | Admitting: Internal Medicine

## 2014-10-09 VITALS — BP 108/68 | HR 60 | Temp 98.0°F | Resp 16 | Ht 72.0 in | Wt 163.0 lb

## 2014-10-09 DIAGNOSIS — M5416 Radiculopathy, lumbar region: Secondary | ICD-10-CM | POA: Diagnosis not present

## 2014-10-09 DIAGNOSIS — M5020 Other cervical disc displacement, unspecified cervical region: Secondary | ICD-10-CM

## 2014-10-09 DIAGNOSIS — M5126 Other intervertebral disc displacement, lumbar region: Secondary | ICD-10-CM | POA: Insufficient documentation

## 2014-10-09 DIAGNOSIS — S13101A Dislocation of unspecified cervical vertebrae, initial encounter: Secondary | ICD-10-CM

## 2014-10-09 MED ORDER — MELOXICAM 15 MG PO TABS: 15.0000 mg | ORAL_TABLET | Freq: Every day | ORAL | Status: DC

## 2014-10-09 MED ORDER — METHYLPREDNISOLONE ACETATE 80 MG/ML IJ SUSP
120.0000 mg | Freq: Once | INTRAMUSCULAR | Status: AC
  Administered 2014-10-09: 120 mg via INTRAMUSCULAR

## 2014-10-09 NOTE — Progress Notes (Signed)
Pre visit review using our clinic review tool, if applicable. No additional management support is needed unless otherwise documented below in the visit note. 

## 2014-10-09 NOTE — Patient Instructions (Signed)
Back Pain, Adult Low back pain is very common. About 1 in 5 people have back pain.The cause of low back pain is rarely dangerous. The pain often gets better over time.About half of people with a sudden onset of back pain feel better in just 2 weeks. About 8 in 10 people feel better by 6 weeks.  CAUSES Some common causes of back pain include:  Strain of the muscles or ligaments supporting the spine.  Wear and tear (degeneration) of the spinal discs.  Arthritis.  Direct injury to the back. DIAGNOSIS Most of the time, the direct cause of low back pain is not known.However, back pain can be treated effectively even when the exact cause of the pain is unknown.Answering your caregiver's questions about your overall health and symptoms is one of the most accurate ways to make sure the cause of your pain is not dangerous. If your caregiver needs more information, he or she may order lab work or imaging tests (X-rays or MRIs).However, even if imaging tests show changes in your back, this usually does not require surgery. HOME CARE INSTRUCTIONS For many people, back pain returns.Since low back pain is rarely dangerous, it is often a condition that people can learn to manageon their own.   Remain active. It is stressful on the back to sit or stand in one place. Do not sit, drive, or stand in one place for more than 30 minutes at a time. Take short walks on level surfaces as soon as pain allows.Try to increase the length of time you walk each day.  Do not stay in bed.Resting more than 1 or 2 days can delay your recovery.  Do not avoid exercise or work.Your body is made to move.It is not dangerous to be active, even though your back may hurt.Your back will likely heal faster if you return to being active before your pain is gone.  Pay attention to your body when you bend and lift. Many people have less discomfortwhen lifting if they bend their knees, keep the load close to their bodies,and  avoid twisting. Often, the most comfortable positions are those that put less stress on your recovering back.  Find a comfortable position to sleep. Use a firm mattress and lie on your side with your knees slightly bent. If you lie on your back, put a pillow under your knees.  Only take over-the-counter or prescription medicines as directed by your caregiver. Over-the-counter medicines to reduce pain and inflammation are often the most helpful.Your caregiver may prescribe muscle relaxant drugs.These medicines help dull your pain so you can more quickly return to your normal activities and healthy exercise.  Put ice on the injured area.  Put ice in a plastic bag.  Place a towel between your skin and the bag.  Leave the ice on for 15-20 minutes, 03-04 times a day for the first 2 to 3 days. After that, ice and heat may be alternated to reduce pain and spasms.  Ask your caregiver about trying back exercises and gentle massage. This may be of some benefit.  Avoid feeling anxious or stressed.Stress increases muscle tension and can worsen back pain.It is important to recognize when you are anxious or stressed and learn ways to manage it.Exercise is a great option. SEEK MEDICAL CARE IF:  You have pain that is not relieved with rest or medicine.  You have pain that does not improve in 1 week.  You have new symptoms.  You are generally not feeling well. SEEK   IMMEDIATE MEDICAL CARE IF:   You have pain that radiates from your back into your legs.  You develop new bowel or bladder control problems.  You have unusual weakness or numbness in your arms or legs.  You develop nausea or vomiting.  You develop abdominal pain.  You feel faint. Document Released: 04/21/2005 Document Revised: 10/21/2011 Document Reviewed: 08/23/2013 [employer] Patient Information 2015 [employer]. This information is not intended to replace advice given to you by your health care provider. Make sure you  discuss any questions you have with your health care provider.  

## 2014-10-09 NOTE — Progress Notes (Signed)
Subjective:  Patient ID: Derek [name], male    DOB: [birth year]  Age: 39 y.o. MRN: [id number]  CC: Back Pain   HPI Derek [name] presents for a recurrent episode of LBP, he has had episodes like this for about 15 years. The most recent episode started about 1 week ago with no trauma or injury. The pain is sharp and stabbing and at first it radiated into his LLE but that has resolved. He denies any N/W/T or B/B incontinence or retention. He has tried low dose motrin with some relief from the pain.  Outpatient Prescriptions Prior to Visit  Medication Sig Dispense Refill  . loratadine (CLARITIN) 10 MG tablet Take 10 mg by mouth daily.    Marland Kitchen. triamcinolone (NASACORT) 55 MCG/ACT AERO nasal inhaler USE 2 SPRAYS IN EACH NOSTRIL ONCE A DAY (Patient not taking: Reported on 10/09/2014) 16.5 g 11   No facility-administered medications prior to visit.    ROS Review of Systems  Constitutional: Negative.  Negative for fever, chills, diaphoresis, appetite change and fatigue.  HENT: Negative.   Eyes: Negative.   Respiratory: Negative.  Negative for cough, choking, chest tightness, shortness of breath and stridor.   Cardiovascular: Negative.  Negative for chest pain, palpitations and leg swelling.  Gastrointestinal: Negative.  Negative for abdominal pain.  Endocrine: Negative.   Genitourinary: Negative.   Musculoskeletal: Positive for back pain. Negative for myalgias, joint swelling, arthralgias, gait problem, neck pain and neck stiffness.  Skin: Negative.   Allergic/Immunologic: Negative.   Neurological: Negative.  Negative for dizziness, tremors, syncope, weakness, light-headedness and numbness.  Hematological: Negative.  Negative for adenopathy. Does not bruise/bleed easily.  Psychiatric/Behavioral: Negative.     Objective:  BP 108/68 mmHg  Pulse 60  Temp(Src) 98 F (36.7 C) (Oral)  Resp 16  Ht 6' (1.829 m)  Wt 163 lb (73.936 kg)  BMI 22.10 kg/m2  SpO2 99%  BP Readings from Last 3  Encounters:  10/09/14 108/68  01/20/14 118/70  01/03/14 112/82    Wt Readings from Last 3 Encounters:  10/09/14 163 lb (73.936 kg)  01/20/14 154 lb (69.854 kg)  01/03/14 152 lb (68.947 kg)    Physical Exam  Constitutional: He is oriented to person, place, and time. He appears well-developed and well-nourished. No distress.  HENT:  Head: Normocephalic and atraumatic.  Mouth/Throat: Oropharynx is clear and moist. No oropharyngeal exudate.  Eyes: Conjunctivae are normal. Right eye exhibits no discharge. Left eye exhibits no discharge. No scleral icterus.  Neck: Normal range of motion. Neck supple. No JVD present. No tracheal deviation present. No thyromegaly present.  Cardiovascular: Normal rate, regular rhythm, normal heart sounds and intact distal pulses.  Exam reveals no gallop and no friction rub.   No murmur heard. Pulmonary/Chest: Effort normal and breath sounds normal. No stridor. No respiratory distress. He has no wheezes. He has no rales. He exhibits no tenderness.  Abdominal: Soft. Bowel sounds are normal. He exhibits no distension and no mass. There is no tenderness. There is no rebound and no guarding.  Musculoskeletal: Normal range of motion. He exhibits no edema or tenderness.       Lumbar back: Normal. He exhibits normal range of motion, no tenderness, no bony tenderness, no swelling, no edema, no deformity, no laceration, no pain, no spasm and normal pulse.  Lymphadenopathy:    He has no cervical adenopathy.  Neurological: He is alert and oriented to person, place, and time. He has normal strength. He displays no atrophy, no  tremor and normal reflexes. No cranial nerve deficit or sensory deficit. He exhibits normal muscle tone. He displays no seizure activity. Coordination and gait normal.  Reflex Scores:      Tricep reflexes are 1+ on the right side and 1+ on the left side.      Bicep reflexes are 1+ on the right side and 1+ on the left side.      Brachioradialis reflexes  are 1+ on the right side and 1+ on the left side.      Patellar reflexes are 1+ on the right side and 1+ on the left side.      Achilles reflexes are 1+ on the right side and 1+ on the left side. Mildly + SLR in LLE Neg SLR in RLE  Skin: Skin is warm and dry. No rash noted. He is not diaphoretic. No erythema.  Vitals reviewed.   Lab Results  Component Value Date   WBC 6.3 08/29/2013   HGB 14.3 08/29/2013   HCT 42.5 08/29/2013   PLT 173.0 08/29/2013   GLUCOSE 89 08/29/2013   CHOL 122 08/29/2013   TRIG 21.0 08/29/2013   HDL 59.00 08/29/2013   LDLCALC 59 08/29/2013   ALT 18 08/29/2013   AST 25 08/29/2013   NA 139 08/29/2013   K 5.1 08/29/2013   CL 105 08/29/2013   CREATININE 0.9 08/29/2013   BUN 8 08/29/2013   CO2 27 08/29/2013   TSH 1.85 08/29/2013    Dg Chest 2 View  01/20/2014   CLINICAL DATA:  Followup pneumonia.  No current complaints.  EXAM: CHEST  2 VIEW  COMPARISON:  01/03/2014.  FINDINGS: Right lower lobe pneumonia has resolved. Lungs are now clear. No pleural effusion or pneumothorax.  Normal heart, mediastinum and hila.  Bony thorax is unremarkable.  IMPRESSION: Resolved right lower lobe pneumonia.   Electronically Signed   By: Amie Portland M.D.   On: 01/20/2014 15:16    Assessment & Plan:   Derek [name] was seen today for back pain.  Diagnoses and all orders for this visit:  Left lumbar radiculitis - he has s/s c/w a mild lumbar disc herniation so I gave him an injection of depo-medrol to reduce the inflammation and pain, will change motrin to mobic, we discussed getting an MRI done but we both agreed that it was not indicated at this time Orders: -     meloxicam (MOBIC) 15 MG tablet; Take 1 tablet (15 mg total) by mouth daily. -     methylPREDNISolone acetate (DEPO-MEDROL) injection 120 mg; Inject 1.5 mLs (120 mg total) into the muscle once.  Traumatic disc herniation of cervical spine - this has improved and is not bothering him at this time Orders: -      methylPREDNISolone acetate (DEPO-MEDROL) injection 120 mg; Inject 1.5 mLs (120 mg total) into the muscle once.  I have discontinued Mr. Coronado loratadine and triamcinolone. I am also having him start on meloxicam. We administered methylPREDNISolone acetate.  Meds ordered this encounter  Medications  . meloxicam (MOBIC) 15 MG tablet    Sig: Take 1 tablet (15 mg total) by mouth daily.    Dispense:  90 tablet    Refill:  1  . methylPREDNISolone acetate (DEPO-MEDROL) injection 120 mg    Sig:      Follow-up: Return in about 6 weeks (around 11/20/2014).  Sanda Linger, MD

## 2015-07-27 ENCOUNTER — Encounter: Payer: Self-pay | Admitting: Nurse Practitioner

## 2015-07-27 ENCOUNTER — Ambulatory Visit (INDEPENDENT_AMBULATORY_CARE_PROVIDER_SITE_OTHER): Payer: Managed Care, Other (non HMO) | Admitting: Nurse Practitioner

## 2015-07-27 VITALS — BP 122/62 | HR 63 | Temp 98.3°F | Ht 72.0 in | Wt 163.0 lb

## 2015-07-27 DIAGNOSIS — IMO0001 Reserved for inherently not codable concepts without codable children: Secondary | ICD-10-CM | POA: Insufficient documentation

## 2015-07-27 DIAGNOSIS — S6991XA Unspecified injury of right wrist, hand and finger(s), initial encounter: Secondary | ICD-10-CM

## 2015-07-27 NOTE — Patient Instructions (Signed)

## 2015-07-27 NOTE — Progress Notes (Signed)
Pre visit review using our clinic review tool, if applicable. No additional management support is needed unless otherwise documented below in the visit note. 

## 2015-07-27 NOTE — Progress Notes (Signed)
Patient ID: Derek [name], male    DOB: 10-28-1975  Age: 40 y.o. MRN: [id number]  CC: Hand Injury   HPI Derek Pimplenthony J Mertens presents for CC of second finger on right hand injury x 2 days.   1) Pt was working with a hand grinder the guard slipped and he cut his right hand pointer finger near the MIP joint. He cleaned it up at home with soap and water and then found a splint at his pharmacy. Denies purulence, increasing pain, erythema, or trouble bending finger or numbness.    History Derek [name] has a past medical history of ALLERGIC RHINITIS CAUSE UNSPECIFIED (06/01/2009); No pertinent past medical history; and Complication of anesthesia.   He has past surgical history that includes wisdom teeth removal; Inguinal hernia repair (06/07/2012); Insertion of mesh (06/07/2012); and Hernia repair.   His family history includes Arthritis in his other; Hyperlipidemia in his other; Hypertension in his other.He reports that he has never smoked. He has never used smokeless tobacco. He reports that he does not drink alcohol or use illicit drugs.  Outpatient Prescriptions Prior to Visit  Medication Sig Dispense Refill  . meloxicam (MOBIC) 15 MG tablet Take 1 tablet (15 mg total) by mouth daily. (Patient not taking: Reported on 07/27/2015) 90 tablet 1   No facility-administered medications prior to visit.    ROS Review of Systems  Constitutional: Negative for fever, chills, diaphoresis and fatigue.  Eyes: Negative for visual disturbance.  Respiratory: Negative for chest tightness, shortness of breath and wheezing.   Cardiovascular: Negative for chest pain, palpitations and leg swelling.  Gastrointestinal: Negative for nausea, vomiting and diarrhea.  Skin: Positive for wound.  Neurological: Negative for dizziness, light-headedness and headaches.   Objective:  BP 122/62 mmHg  Pulse 63  Temp(Src) 98.3 F (36.8 C) (Oral)  Ht 6' (1.829 m)  Wt 163 lb (73.936 kg)  BMI 22.10 kg/m2  SpO2 98%  Physical Exam    Constitutional: He is oriented to person, place, and time. He appears well-developed and well-nourished. No distress.  HENT:  Head: Normocephalic and atraumatic.  Right Ear: External ear normal.  Left Ear: External ear normal.  Cardiovascular: Normal rate and regular rhythm.   Pulmonary/Chest: Effort normal and breath sounds normal. No respiratory distress. He has no wheezes. He has no rales. He exhibits no tenderness.  Neurological: He is alert and oriented to person, place, and time.  Skin: Skin is warm and dry. No rash noted. He is not diaphoretic. There is erythema.     Small 2-3 mm cut, through epidermis, but not through other structures, slightly serosanguinous drainage from sight other wise non-tender and normal sensation   Psychiatric: He has a normal mood and affect. His behavior is normal. Judgment and thought content normal.   Assessment & Plan:   Derek [name] was seen today for hand injury.  Diagnoses and all orders for this visit:  Injury of second finger of right hand, initial encounter   I have discontinued Mr. Cohron's meloxicam.  No orders of the defined types were placed in this encounter.     Follow-up: Return if symptoms worsen or fail to improve.

## 2015-07-27 NOTE — Assessment & Plan Note (Signed)
24 hrs post injury  Well Appearing  Cleaned and irrigated site with NS  Dermabond on site  Gave instructions on AVS

## 2015-11-21 ENCOUNTER — Encounter: Payer: Self-pay | Admitting: Internal Medicine

## 2015-11-22 ENCOUNTER — Other Ambulatory Visit: Payer: Self-pay | Admitting: Internal Medicine

## 2015-11-22 DIAGNOSIS — N410 Acute prostatitis: Secondary | ICD-10-CM

## 2015-11-22 MED ORDER — SULFAMETHOXAZOLE-TRIMETHOPRIM 800-160 MG PO TABS: 1.0000 | ORAL_TABLET | Freq: Two times a day (BID) | ORAL | Status: AC

## 2016-01-30 ENCOUNTER — Encounter: Payer: Self-pay | Admitting: Internal Medicine

## 2016-01-30 ENCOUNTER — Ambulatory Visit (INDEPENDENT_AMBULATORY_CARE_PROVIDER_SITE_OTHER): Payer: Managed Care, Other (non HMO) | Admitting: Internal Medicine

## 2016-01-30 VITALS — BP 118/78 | HR 58 | Temp 98.2°F | Resp 16 | Ht 72.0 in | Wt 157.0 lb

## 2016-01-30 DIAGNOSIS — J029 Acute pharyngitis, unspecified: Secondary | ICD-10-CM

## 2016-01-30 DIAGNOSIS — J028 Acute pharyngitis due to other specified organisms: Secondary | ICD-10-CM

## 2016-01-30 DIAGNOSIS — Z23 Encounter for immunization: Secondary | ICD-10-CM | POA: Diagnosis not present

## 2016-01-30 LAB — POCT RAPID STREP A (OFFICE): Rapid Strep A Screen: NEGATIVE

## 2016-01-30 MED ORDER — AZITHROMYCIN 500 MG PO TABS: 500.0000 mg | ORAL_TABLET | Freq: Every day | ORAL | 0 refills | Status: AC

## 2016-01-30 NOTE — Patient Instructions (Signed)

## 2016-01-30 NOTE — Progress Notes (Signed)
Subjective:  Patient ID: Derek [name], male    DOB: 1976-03-07  Age: 40 y.o. MRN: [id number]  CC: Sore Throat   HPI Derek [name] presents for a 10 day history of sore throat, mild neck lymphadenopathy, and fever to 99.5. He has been taking Tylenol, Advil, and ligament/honey lozenges for symptom relief.  No outpatient prescriptions prior to visit.   No facility-administered medications prior to visit.     ROS Review of Systems  Constitutional: Positive for fever. Negative for appetite change, chills, diaphoresis and fatigue.  HENT: Positive for sore throat. Negative for facial swelling, sinus pressure, trouble swallowing and voice change.   Eyes: Negative.   Respiratory: Negative for cough, chest tightness, shortness of breath and wheezing.   Cardiovascular: Negative.  Negative for chest pain, palpitations and leg swelling.  Gastrointestinal: Negative for abdominal pain, constipation, diarrhea, nausea and vomiting.  Endocrine: Negative.   Genitourinary: Negative.  Negative for dysuria, hematuria and urgency.  Musculoskeletal: Negative for arthralgias, back pain, myalgias and neck pain.  Skin: Negative.  Negative for color change and rash.  Allergic/Immunologic: Negative.   Neurological: Negative.   Hematological: Positive for adenopathy. Does not bruise/bleed easily.  Psychiatric/Behavioral: Negative.     Objective:  BP 118/78 (BP Location: Left Arm, Patient Position: Sitting, Cuff Size: Normal)   Pulse (!) 58   Temp 98.2 F (36.8 C) (Oral)   Resp 16   Ht 6' (1.829 m)   Wt 157 lb (71.2 kg)   SpO2 98%   BMI 21.29 kg/m   BP Readings from Last 3 Encounters:  01/30/16 118/78  07/27/15 122/62  10/09/14 108/68    Wt Readings from Last 3 Encounters:  01/30/16 157 lb (71.2 kg)  07/27/15 163 lb (73.9 kg)  10/09/14 163 lb (73.9 kg)    Physical Exam  Constitutional: He is oriented to person, place, and time. No distress.  HENT:  Mouth/Throat: Mucous membranes  are normal. Mucous membranes are not pale, not dry and not cyanotic. No oral lesions. No trismus in the jaw. No uvula swelling. Posterior oropharyngeal erythema present. No oropharyngeal exudate, posterior oropharyngeal edema or tonsillar abscesses.  Eyes: Conjunctivae are normal. Right eye exhibits no discharge. Left eye exhibits no discharge. No scleral icterus.  Neck: Normal range of motion. Neck supple. No JVD present. No tracheal deviation present. No thyromegaly present.  Cardiovascular: Normal rate, regular rhythm, normal heart sounds and intact distal pulses.  Exam reveals no gallop and no friction rub.   No murmur heard. Pulmonary/Chest: Effort normal and breath sounds normal. No stridor. No respiratory distress. He has no wheezes. He has no rales. He exhibits no tenderness.  Abdominal: Soft. Bowel sounds are normal. He exhibits no distension and no mass. There is no tenderness. There is no rebound and no guarding.  Musculoskeletal: Normal range of motion. He exhibits no edema, tenderness or deformity.  Lymphadenopathy:    He has no cervical adenopathy.  Neurological: He is oriented to person, place, and time.  Skin: Skin is warm and dry. No rash noted. He is not diaphoretic. No erythema. No pallor.  Vitals reviewed.   Lab Results  Component Value Date   WBC 6.3 08/29/2013   HGB 14.3 08/29/2013   HCT 42.5 08/29/2013   PLT 173.0 08/29/2013   GLUCOSE 89 08/29/2013   CHOL 122 08/29/2013   TRIG 21.0 08/29/2013   HDL 59.00 08/29/2013   LDLCALC 59 08/29/2013   ALT 18 08/29/2013   AST 25 08/29/2013   NA  139 08/29/2013   K 5.1 08/29/2013   CL 105 08/29/2013   CREATININE 0.9 08/29/2013   BUN 8 08/29/2013   CO2 27 08/29/2013   TSH 1.85 08/29/2013    Dg Chest 2 View  Result Date: 01/20/2014 CLINICAL DATA:  Followup pneumonia.  No current complaints. EXAM: CHEST  2 VIEW COMPARISON:  01/03/2014. FINDINGS: Right lower lobe pneumonia has resolved. Lungs are now clear. No pleural  effusion or pneumothorax. Normal heart, mediastinum and hila. Bony thorax is unremarkable. IMPRESSION: Resolved right lower lobe pneumonia. Electronically Signed   By: Amie Portlandavid  Ormond M.D.   On: 01/20/2014 15:16    Assessment & Plan:   Derek [name] was seen today for sore throat.  Diagnoses and all orders for this visit:  Sore throat -     POCT rapid strep A  Acute pharyngitis due to other specified organisms- though his rapid strep screen is negative his [facility] criteria is positive at 2 so I will empirically treat with a course of azithromycin -     azithromycin (ZITHROMAX) 500 MG tablet; Take 1 tablet (500 mg total) by mouth daily.  Need for prophylactic vaccination and inoculation against influenza -     Flu Vaccine QUAD 36+ mos IM   I am having Mr. Derek [name] start on azithromycin.  Meds ordered this encounter  Medications  . azithromycin (ZITHROMAX) 500 MG tablet    Sig: Take 1 tablet (500 mg total) by mouth daily.    Dispense:  3 tablet    Refill:  0     Follow-up: Return in about 3 weeks (around 02/20/2016).  Sanda Lingerhomas Verlie Liotta, MD

## 2016-01-30 NOTE — Progress Notes (Signed)
Pre visit review using our clinic review tool, if applicable. No additional management support is needed unless otherwise documented below in the visit note. 

## 2016-03-14 ENCOUNTER — Encounter: Payer: Self-pay | Admitting: Internal Medicine

## 2016-03-18 ENCOUNTER — Ambulatory Visit (INDEPENDENT_AMBULATORY_CARE_PROVIDER_SITE_OTHER): Payer: Managed Care, Other (non HMO) | Admitting: Internal Medicine

## 2016-03-18 ENCOUNTER — Encounter: Payer: Self-pay | Admitting: Internal Medicine

## 2016-03-18 ENCOUNTER — Ambulatory Visit (INDEPENDENT_AMBULATORY_CARE_PROVIDER_SITE_OTHER)
Admission: RE | Admit: 2016-03-18 | Discharge: 2016-03-18 | Disposition: A | Discharge status: Home / Self Care | Payer: Managed Care, Other (non HMO) | Source: Ambulatory Visit | Attending: Internal Medicine | Admitting: Internal Medicine

## 2016-03-18 VITALS — BP 122/80 | HR 77 | Temp 98.1°F | Resp 16 | Ht 72.0 in | Wt 157.8 lb

## 2016-03-18 DIAGNOSIS — M549 Dorsalgia, unspecified: Secondary | ICD-10-CM

## 2016-03-18 DIAGNOSIS — M5126 Other intervertebral disc displacement, lumbar region: Secondary | ICD-10-CM | POA: Diagnosis not present

## 2016-03-18 DIAGNOSIS — G8929 Other chronic pain: Secondary | ICD-10-CM

## 2016-03-18 MED ORDER — METHYLPREDNISOLONE 4 MG PO TBPK: ORAL_TABLET | ORAL | 0 refills | Status: DC

## 2016-03-18 MED ORDER — ETODOLAC ER 500 MG PO TB24: 500.0000 mg | ORAL_TABLET | Freq: Every day | ORAL | 1 refills | Status: DC

## 2016-03-18 NOTE — Patient Instructions (Signed)

## 2016-03-18 NOTE — Progress Notes (Signed)
Subjective:  Patient ID: Derek [name], male    DOB: 17-Jul-1975  Age: 40 y.o. MRN: [id number]  CC: Back Pain   HPI Derek [name] presents for intermittent LBP for 4 months. He describes intermittent episodes of back pain that is mostly dull aching but occasionally sharp. The pain is slightly more prominent on the right than the left. The pain increases when he sits in a car for a long time and sometimes when he bends or leans forward. The pain does not radiate into his lower extremities and he denies paresthesias. He has tried a few doses of over-the-counter anti-inflammatories and meloxicam with moderate symptom relief.  No outpatient prescriptions prior to visit.   No facility-administered medications prior to visit.     ROS Review of Systems  Constitutional: Negative.  Negative for activity change, chills, diaphoresis, fatigue and fever.  HENT: Negative.   Eyes: Negative for visual disturbance.  Respiratory: Negative.  Negative for cough, choking and stridor.   Cardiovascular: Negative.  Negative for chest pain, palpitations and leg swelling.  Gastrointestinal: Negative for abdominal pain, constipation, diarrhea, nausea and vomiting.  Genitourinary: Negative.   Musculoskeletal: Positive for back pain. Negative for arthralgias, joint swelling, myalgias and neck pain.  Skin: Negative.   Neurological: Negative.  Negative for weakness and numbness.  Hematological: Negative.  Negative for adenopathy. Does not bruise/bleed easily.  Psychiatric/Behavioral: Negative.     Objective:  BP 122/80 (BP Location: Left Arm, Patient Position: Sitting, Cuff Size: Normal)   Pulse 77   Temp 98.1 F (36.7 C) (Oral)   Resp 16   Ht 6' (1.829 m)   Wt 157 lb 12 oz (71.6 kg)   SpO2 97%   BMI 21.39 kg/m   BP Readings from Last 3 Encounters:  03/18/16 122/80  01/30/16 118/78  07/27/15 122/62    Wt Readings from Last 3 Encounters:  03/18/16 157 lb 12 oz (71.6 kg)  01/30/16 157 lb (71.2  kg)  07/27/15 163 lb (73.9 kg)    Physical Exam  Constitutional: He is oriented to person, place, and time. No distress.  HENT:  Mouth/Throat: Oropharynx is clear and moist. No oropharyngeal exudate.  Eyes: Conjunctivae are normal. Right eye exhibits no discharge. Left eye exhibits no discharge. No scleral icterus.  Neck: Normal range of motion. Neck supple. No JVD present. No tracheal deviation present. No thyromegaly present.  Cardiovascular: Normal rate, regular rhythm, normal heart sounds and intact distal pulses.  Exam reveals no gallop and no friction rub.   No murmur heard. Pulmonary/Chest: Effort normal and breath sounds normal. No stridor. No respiratory distress. He has no wheezes. He has no rales. He exhibits no tenderness.  Abdominal: Soft. Bowel sounds are normal. He exhibits no distension and no mass. There is no tenderness. There is no rebound and no guarding.  Musculoskeletal: Normal range of motion. He exhibits no edema, tenderness or deformity.       Lumbar back: Normal. He exhibits normal range of motion, no tenderness, no bony tenderness, no edema and no deformity.  Neurological: He is oriented to person, place, and time. He has normal strength. He displays no atrophy and no tremor. No cranial nerve deficit or sensory deficit. He exhibits normal muscle tone. He displays a negative Romberg sign. He displays no seizure activity. Coordination and gait normal.  NEG SLR in BLE  Skin: Skin is warm and dry. No rash noted. He is not diaphoretic. No erythema. No pallor.  Psychiatric: He has a normal  mood and affect. His behavior is normal. Judgment and thought content normal.  Vitals reviewed.   Lab Results  Component Value Date   WBC 6.3 08/29/2013   HGB 14.3 08/29/2013   HCT 42.5 08/29/2013   PLT 173.0 08/29/2013   GLUCOSE 89 08/29/2013   CHOL 122 08/29/2013   TRIG 21.0 08/29/2013   HDL 59.00 08/29/2013   LDLCALC 59 08/29/2013   ALT 18 08/29/2013   AST 25 08/29/2013    NA 139 08/29/2013   K 5.1 08/29/2013   CL 105 08/29/2013   CREATININE 0.9 08/29/2013   BUN 8 08/29/2013   CO2 27 08/29/2013   TSH 1.85 08/29/2013    Dg Chest 2 View  Result Date: 01/20/2014 CLINICAL DATA:  Followup pneumonia.  No current complaints. EXAM: CHEST  2 VIEW COMPARISON:  01/03/2014. FINDINGS: Right lower lobe pneumonia has resolved. Lungs are now clear. No pleural effusion or pneumothorax. Normal heart, mediastinum and hila. Bony thorax is unremarkable. IMPRESSION: Resolved right lower lobe pneumonia. Electronically Signed   By: Amie Portlandavid  Ormond M.D.   On: 01/20/2014 15:16    Assessment & Plan:   Derek [name] was seen today for back pain.  Diagnoses and all orders for this visit:  Chronic right-sided back pain, unspecified back location- plain film is remarkable only for one area of decreased disc space. Will treat with an anti-inflammatory and if the pain doesn't resolve soon then I may consider having an MRI done to screen for disc herniation, mass, tumor. -     etodolac (LODINE XL) 500 MG 24 hr tablet; Take 1 tablet (500 mg total) by mouth daily. -     DG Lumbar Spine Complete; Future  Lumbar disc herniation- his symptoms are suspicious for disc herniation but there is no evidence of a radiculopathy. I have recommended a short course of steroids to decrease the pain and inflammation and anti-inflammatory therapy. If the pain does not improve soon and will consider having an MRI done and/or starting physical therapy. -     etodolac (LODINE XL) 500 MG 24 hr tablet; Take 1 tablet (500 mg total) by mouth daily. -     methylPREDNISolone (MEDROL DOSEPAK) 4 MG TBPK tablet; TAKE AS DIRECTED   I am having Derek [name] start on etodolac and methylPREDNISolone.  Meds ordered this encounter  Medications  . etodolac (LODINE XL) 500 MG 24 hr tablet    Sig: Take 1 tablet (500 mg total) by mouth daily.    Dispense:  90 tablet    Refill:  1  . methylPREDNISolone (MEDROL DOSEPAK) 4 MG TBPK  tablet    Sig: TAKE AS DIRECTED    Dispense:  21 tablet    Refill:  0     Follow-up: Return in about 2 months (around 05/18/2016).  Sanda Lingerhomas Jaidyn Kuhl, MD

## 2016-03-18 NOTE — Progress Notes (Signed)
Pre visit review using our clinic review tool, if applicable. No additional management support is needed unless otherwise documented below in the visit note. 

## 2016-03-19 ENCOUNTER — Encounter: Payer: Self-pay | Admitting: Internal Medicine

## 2016-03-20 ENCOUNTER — Encounter: Payer: Self-pay | Admitting: Internal Medicine

## 2016-03-20 ENCOUNTER — Other Ambulatory Visit: Payer: Self-pay | Admitting: Internal Medicine

## 2016-05-15 ENCOUNTER — Ambulatory Visit (INDEPENDENT_AMBULATORY_CARE_PROVIDER_SITE_OTHER): Payer: Commercial Managed Care - PPO | Admitting: Internal Medicine

## 2016-05-15 ENCOUNTER — Encounter: Payer: Self-pay | Admitting: Internal Medicine

## 2016-05-15 VITALS — BP 126/70 | HR 72 | Temp 97.8°F | Resp 16 | Ht 72.0 in | Wt 160.2 lb

## 2016-05-15 DIAGNOSIS — M5126 Other intervertebral disc displacement, lumbar region: Secondary | ICD-10-CM

## 2016-05-15 DIAGNOSIS — S13101A Dislocation of unspecified cervical vertebrae, initial encounter: Secondary | ICD-10-CM | POA: Diagnosis not present

## 2016-05-15 DIAGNOSIS — M545 Low back pain, unspecified: Secondary | ICD-10-CM

## 2016-05-15 NOTE — Progress Notes (Signed)
Subjective:  Patient ID: Derek [name], male    DOB: 1975/09/26  Age: 41 y.o. MRN: [id number]  CC: Back Pain   HPI Derek [name] presents for recurrent episodes of nonradiating neck and back pain. He saw a neurosurgeon about 3 years ago about his neck and he was told that he had a disc herniation and that if he decided he wanted to have surgery he should contact the neurosurgeon again. He is not currently taking anything for pain because he didn't notice any improvement with etodolac or the steroid Dosepak. He denies numbness, weakness, tingling in his arms or legs.  Outpatient Medications Prior to Visit  Medication Sig Dispense Refill  . etodolac (LODINE XL) 500 MG 24 hr tablet Take 1 tablet (500 mg total) by mouth daily. (Patient not taking: Reported on 05/15/2016) 90 tablet 1  . methylPREDNISolone (MEDROL DOSEPAK) 4 MG TBPK tablet TAKE AS DIRECTED (Patient not taking: Reported on 05/15/2016) 21 tablet 0   No facility-administered medications prior to visit.     ROS Review of Systems  Constitutional: Negative.  Negative for chills, fatigue and fever.  HENT: Negative.   Eyes: Negative.   Respiratory: Negative for cough, shortness of breath and wheezing.   Cardiovascular: Negative for chest pain, palpitations and leg swelling.  Gastrointestinal: Negative for constipation.  Endocrine: Negative.   Genitourinary: Negative.   Musculoskeletal: Positive for back pain and neck pain. Negative for myalgias.  Allergic/Immunologic: Negative.   Neurological: Negative.  Negative for weakness, numbness and headaches.  Hematological: Negative.  Negative for adenopathy. Does not bruise/bleed easily.  Psychiatric/Behavioral: Negative.     Objective:  BP 126/70 (BP Location: Left Arm, Patient Position: Sitting, Cuff Size: Normal)   Pulse 72   Temp 97.8 F (36.6 C) (Oral)   Resp 16   Ht 6' (1.829 m)   Wt 160 lb 4 oz (72.7 kg)   SpO2 99%   BMI 21.73 kg/m   BP Readings from Last 3  Encounters:  05/15/16 126/70  03/18/16 122/80  01/30/16 118/78    Wt Readings from Last 3 Encounters:  05/15/16 160 lb 4 oz (72.7 kg)  03/18/16 157 lb 12 oz (71.6 kg)  01/30/16 157 lb (71.2 kg)    Physical Exam  Constitutional: He is oriented to person, place, and time. No distress.  HENT:  Mouth/Throat: Oropharynx is clear and moist. No oropharyngeal exudate.  Eyes: Conjunctivae are normal. Right eye exhibits no discharge. Left eye exhibits no discharge. No scleral icterus.  Neck: Normal range of motion. Neck supple. No JVD present. No tracheal deviation present. No thyromegaly present.  Cardiovascular: Normal rate, regular rhythm, normal heart sounds and intact distal pulses.  Exam reveals no gallop and no friction rub.   No murmur heard. Pulmonary/Chest: Effort normal and breath sounds normal. No stridor. No respiratory distress. He has no wheezes. He has no rales. He exhibits no tenderness.  Abdominal: Soft. Bowel sounds are normal. He exhibits no distension and no mass. There is no tenderness. There is no rebound and no guarding.  Musculoskeletal: He exhibits no edema, tenderness or deformity.  Lymphadenopathy:    He has no cervical adenopathy.  Neurological: He is alert and oriented to person, place, and time. He has normal reflexes. He displays normal reflexes. No cranial nerve deficit. He exhibits normal muscle tone. Coordination normal.  Skin: Skin is warm and dry. No rash noted. He is not diaphoretic. No erythema. No pallor.  Vitals reviewed.   Lab Results  Component  Value Date   WBC 6.3 08/29/2013   HGB 14.3 08/29/2013   HCT 42.5 08/29/2013   PLT 173.0 08/29/2013   GLUCOSE 89 08/29/2013   CHOL 122 08/29/2013   TRIG 21.0 08/29/2013   HDL 59.00 08/29/2013   LDLCALC 59 08/29/2013   ALT 18 08/29/2013   AST 25 08/29/2013   NA 139 08/29/2013   K 5.1 08/29/2013   CL 105 08/29/2013   CREATININE 0.9 08/29/2013   BUN 8 08/29/2013   CO2 27 08/29/2013   TSH 1.85  08/29/2013    Dg Lumbar Spine Complete  Result Date: 03/18/2016 CLINICAL DATA:  41 year old male with low back pain for 4 months. No known injury. Initial encounter. EXAM: LUMBAR SPINE - COMPLETE 4+ VIEW COMPARISON:  None. FINDINGS: Minimal curvature lumbar spine. Minimal L4-5 disc space narrowing. No pars defect detected. IMPRESSION: Minimal L4-5 disc space narrowing. Electronically Signed   By: Lacy DuverneySteven  Olson M.D.   On: 03/18/2016 17:01    Assessment & Plan:   Ethelene Brownsnthony was seen today for back pain.  Diagnoses and all orders for this visit:  Lumbar disc herniation -     Ambulatory referral to Neurosurgery  Traumatic disc herniation of cervical spine -     Ambulatory referral to Neurosurgery  Low back pain at multiple sites -     Ambulatory referral to Neurosurgery   I have discontinued Mr. Mcdaid's etodolac and methylPREDNISolone.  No orders of the defined types were placed in this encounter.    Follow-up: No Follow-up on file.  Sanda Lingerhomas Hadden Steig, MD

## 2016-05-15 NOTE — Progress Notes (Signed)
Pre visit review using our clinic review tool, if applicable. No additional management support is needed unless otherwise documented below in the visit note. 

## 2016-05-18 NOTE — Patient Instructions (Signed)

## 2017-02-26 ENCOUNTER — Ambulatory Visit (INDEPENDENT_AMBULATORY_CARE_PROVIDER_SITE_OTHER): Payer: Commercial Managed Care - PPO | Admitting: Internal Medicine

## 2017-02-26 ENCOUNTER — Encounter: Payer: Self-pay | Admitting: Internal Medicine

## 2017-02-26 VITALS — BP 122/72 | HR 66 | Temp 98.0°F | Resp 16 | Ht 72.0 in | Wt 159.0 lb

## 2017-02-26 DIAGNOSIS — Z23 Encounter for immunization: Secondary | ICD-10-CM | POA: Diagnosis not present

## 2017-02-26 DIAGNOSIS — M67911 Unspecified disorder of synovium and tendon, right shoulder: Secondary | ICD-10-CM | POA: Diagnosis not present

## 2017-02-26 DIAGNOSIS — M67912 Unspecified disorder of synovium and tendon, left shoulder: Secondary | ICD-10-CM | POA: Diagnosis not present

## 2017-02-26 MED ORDER — ETODOLAC ER 400 MG PO TB24: 400.0000 mg | ORAL_TABLET | Freq: Every day | ORAL | 1 refills | Status: DC

## 2017-02-26 NOTE — Progress Notes (Signed)
Subjective:  Patient ID: Derek [name], male    DOB: 29-Oct-1975  Age: 41 y.o. MRN: [id number]  CC: Shoulder Pain   HPI Derek [name] presents for a 3 month history of discomfort symmetrically in both shoulders.  He does a lot of repetitive overhead activity at work, he works out a lot, and swims.  All of these activities make the discomfort worse.  He is not taking any medications for the discomfort.   No outpatient prescriptions prior to visit.   No facility-administered medications prior to visit.    ROS Review of Systems  Constitutional: Negative.  Negative for fatigue.  HENT: Negative.   Eyes: Negative.  Negative for visual disturbance.  Respiratory: Negative.  Negative for cough, chest tightness and shortness of breath.   Cardiovascular: Negative.  Negative for chest pain, palpitations and leg swelling.  Gastrointestinal: Negative for abdominal pain, diarrhea, nausea and vomiting.  Endocrine: Negative.   Genitourinary: Negative.  Negative for difficulty urinating.  Musculoskeletal: Positive for arthralgias. Negative for back pain, myalgias and neck pain.  Skin: Negative.   Allergic/Immunologic: Negative.   Neurological: Negative.  Negative for dizziness, weakness, light-headedness, numbness and headaches.  Hematological: Negative for adenopathy. Does not bruise/bleed easily.  Psychiatric/Behavioral: Negative.     Objective:  BP 122/72 (BP Location: Left Arm, Patient Position: Sitting, Cuff Size: Normal)   Pulse 66   Temp 98 F (36.7 C) (Oral)   Resp 16   Ht 6' (1.829 m)   Wt 159 lb (72.1 kg)   SpO2 98%   BMI 21.56 kg/m   BP Readings from Last 3 Encounters:  02/26/17 122/72  05/15/16 126/70  03/18/16 122/80    Wt Readings from Last 3 Encounters:  02/26/17 159 lb (72.1 kg)  05/15/16 160 lb 4 oz (72.7 kg)  03/18/16 157 lb 12 oz (71.6 kg)    Physical Exam  Constitutional: He is oriented to person, place, and time. No distress.  HENT:  Mouth/Throat:  Oropharynx is clear and moist. No oropharyngeal exudate.  Eyes: Conjunctivae are normal. Right eye exhibits no discharge. Left eye exhibits no discharge. No scleral icterus.  Neck: Normal range of motion. Neck supple. No JVD present. No thyromegaly present.  Cardiovascular: Normal rate, regular rhythm and intact distal pulses.  Exam reveals no gallop and no friction rub.   No murmur heard. Pulmonary/Chest: Effort normal and breath sounds normal. No respiratory distress. He has no wheezes. He has no rales. He exhibits no tenderness.  Abdominal: Soft. Bowel sounds are normal. He exhibits no distension and no mass. There is no tenderness. There is no rebound and no guarding.  Musculoskeletal: Normal range of motion. He exhibits no edema, tenderness or deformity.       Right shoulder: He exhibits normal range of motion, no tenderness, no bony tenderness, no swelling, no effusion, no crepitus, no deformity, no pain, no spasm and normal strength.       Left shoulder: Normal. He exhibits normal range of motion, no tenderness, no bony tenderness, no swelling, no effusion, no crepitus, no deformity, no pain, no spasm and normal strength.  Lymphadenopathy:    He has no cervical adenopathy.  Neurological: He is alert and oriented to person, place, and time.  Skin: Skin is warm and dry. No rash noted. He is not diaphoretic. No erythema.  Vitals reviewed.   Lab Results  Component Value Date   WBC 6.3 08/29/2013   HGB 14.3 08/29/2013   HCT 42.5 08/29/2013   PLT  173.0 08/29/2013   GLUCOSE 89 08/29/2013   CHOL 122 08/29/2013   TRIG 21.0 08/29/2013   HDL 59.00 08/29/2013   LDLCALC 59 08/29/2013   ALT 18 08/29/2013   AST 25 08/29/2013   NA 139 08/29/2013   K 5.1 08/29/2013   CL 105 08/29/2013   CREATININE 0.9 08/29/2013   BUN 8 08/29/2013   CO2 27 08/29/2013   TSH 1.85 08/29/2013    Dg Lumbar Spine Complete  Result Date: 03/18/2016 CLINICAL DATA:  41 year old male with low back pain for 4  months. No known injury. Initial encounter. EXAM: LUMBAR SPINE - COMPLETE 4+ VIEW COMPARISON:  None. FINDINGS: Minimal curvature lumbar spine. Minimal L4-5 disc space narrowing. No pars defect detected. IMPRESSION: Minimal L4-5 disc space narrowing. Electronically Signed   By: Lacy Duverney M.D.   On: 03/18/2016 17:01    Assessment & Plan:   Shermaine was seen today for shoulder pain.  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 -     Ambulatory referral to Physical Therapy   I am having Derek [name] start on etodolac.  Meds ordered this encounter  Medications  . etodolac (LODINE XL) 400 MG 24 hr tablet    Sig: Take 1 tablet (400 mg total) by mouth daily.    Dispense:  90 tablet    Refill:  1     Follow-up: Return in about 3 months (around 05/29/2017).  Sanda Linger, MD

## 2017-02-26 NOTE — Patient Instructions (Signed)
Shoulder Pain Many things can cause shoulder pain, including:  An injury.  Moving the arm in the same way again and again (overuse).  Joint pain (arthritis).  Follow these instructions at home: Take these actions to help with your pain:  Squeeze a soft ball or a foam pad as much as you can. This helps to prevent swelling. It also makes the arm stronger.  Take over-the-counter and prescription medicines only as told by your doctor.  If told, put ice on the area: ? Put ice in a plastic bag. ? Place a towel between your skin and the bag. ? Leave the ice on for 20 minutes, 2-3 times per day. Stop putting on ice if it does not help with the pain.  If you were given a shoulder sling or immobilizer: ? Wear it as told. ? Remove it to shower or bathe. ? Move your arm as little as possible. ? Keep your hand moving. This helps prevent swelling.  Contact a doctor if:  Your pain gets worse.  Medicine does not help your pain.  You have new pain in your arm, hand, or fingers. Get help right away if:  Your arm, hand, or fingers: ? Tingle. ? Are numb. ? Are swollen. ? Are painful. ? Turn white or blue. This information is not intended to replace advice given to you by your health care provider. Make sure you discuss any questions you have with your health care provider. Document Released: 10/08/2007 Document Revised: 12/16/2015 Document Reviewed: 08/14/2014 [employer] Interactive Patient Education  2018 [employer]  

## 2017-03-11 ENCOUNTER — Ambulatory Visit: Payer: Commercial Managed Care - PPO | Attending: Internal Medicine | Admitting: Physical Therapy

## 2017-03-11 ENCOUNTER — Other Ambulatory Visit: Payer: Self-pay

## 2017-03-11 ENCOUNTER — Encounter: Payer: Self-pay | Admitting: Physical Therapy

## 2017-03-11 DIAGNOSIS — M25512 Pain in left shoulder: Secondary | ICD-10-CM | POA: Diagnosis not present

## 2017-03-11 DIAGNOSIS — G8929 Other chronic pain: Secondary | ICD-10-CM | POA: Diagnosis present

## 2017-03-11 DIAGNOSIS — R293 Abnormal posture: Secondary | ICD-10-CM | POA: Diagnosis present

## 2017-03-11 DIAGNOSIS — M25511 Pain in right shoulder: Secondary | ICD-10-CM | POA: Insufficient documentation

## 2017-03-11 NOTE — Therapy (Signed)
[facility] Outpatient [facility] [address] [place], [place], [address] Phone: [phone]   Fax:  [phone]  Physical Therapy Evaluation  Patient Details  Name: Derek [name] MRN: [id number] Date of Birth: February 02, 1976 Referring Provider: Etta Grandchild, MD   Encounter Date: 03/11/2017  PT End of Session - 03/11/17 1534    Visit Number  1    Number of Visits  8    Date for PT Re-Evaluation  04/08/17    PT Start Time  1423    PT Stop Time  1506    PT Time Calculation (min)  43 min    Activity Tolerance  Patient tolerated treatment well    Behavior During Therapy  [facility] for tasks assessed/performed       Past Medical History:  Diagnosis Date  . ALLERGIC RHINITIS CAUSE UNSPECIFIED 06/01/2009  . Complication of anesthesia    family history- taken longer to go asleep  . No pertinent past medical history     Past Surgical History:  Procedure Laterality Date  . HERNIA REPAIR    . wisdom teeth removal      There were no vitals filed for this visit.   Subjective Assessment - 03/11/17 1428    Subjective  Pt. is 41 yo male referred to OPPT for bilateral shoulder pain. Pt. reports previoulsy rowing 3x/week and swimming 2x/week with some light weights to prevent surgery from neck pain prior to onset of shoulder pain. Pt. also notes prior physical therapy to address issues of neck and low back pain. Pt. reports gradual onset of shoulder pain about 3 months ago without specefic MOI. He states the pain gradually became worse over the first 2-3 weeks before remaining steady for the past few months.    How long can you sit comfortably?  unlimited     How long can you stand comfortably?  unlimited     How long can you walk comfortably?  unlimited     Diagnostic tests  no     Patient Stated Goals  feel better, figure out problem with shoulder, figure out activiteis to limit back and neck pain     Currently in Pain?  Yes    Pain Score  3  5-6/10 at worst     5-6/10 at worst    Pain Location  Shoulder more in left   more in left   Pain Orientation  Right;Left    Pain Descriptors / Indicators  Aching;Dull;Constant    Pain Type  Chronic pain    Pain Radiating Towards  localized to shoulder     Pain Frequency  Constant    Aggravating Factors   overhead motions     Pain Relieving Factors  rest         [facility] PT Assessment - 03/11/17 1519      Assessment   Medical Diagnosis  Bilateral shoulder pain    Referring Provider  Etta Grandchild, MD    Onset Date/Surgical Date  -- approximatley 3 months ago   approximatley 3 months ago   Hand Dominance  Left    Next MD Visit  not scheduled     Prior Therapy  Yes  for neck   for neck     Precautions   Precautions  None      Restrictions   Weight Bearing Restrictions  No      Balance Screen   Has the patient fallen in the past 6 months  No  Has the patient had a decrease in activity level because of a fear of falling?   No    Is the patient reluctant to leave their home because of a fear of falling?   No      Home Public house managernvironment   Living Environment  Private residence    Living Arrangements  Spouse/significant other    Available Help at Discharge  Family    Type of Home  House    Home Access  Stairs to enter    Entrance Stairs-Number of Steps  3    Entrance Stairs-Rails  Right    Home Layout  One level    Home Equipment  None      Prior Function   Level of Independence  Independent    Vocation  Full time employment    Location managerVocation Requirements  repetative lifitng     Leisure  gym; swimming       Cognition   Overall Cognitive Status  Within Functional Limits for tasks assessed      Observation/Other Assessments   Focus on Therapeutic Outcomes (FOTO)   35% limited predicted 23% limited   predicted 23% limited     Posture/Postural Control   Posture/Postural Control  Postural limitations    Postural Limitations  -- scapular winging on Left    scapular winging on Left      AROM    AROM Assessment Site  Shoulder    Right/Left Shoulder  Right;Left    Right Shoulder Flexion  165 Degrees    Right Shoulder ABduction  150 Degrees    Right Shoulder Internal Rotation  60 Degrees    Right Shoulder External Rotation  90 Degrees    Left Shoulder Flexion  160 Degrees    Left Shoulder ABduction  142 Degrees    Left Shoulder Internal Rotation  60 Degrees    Left Shoulder External Rotation  90 Degrees      Strength   Strength Assessment Site  Shoulder    Right/Left Shoulder  Right;Left    Right Shoulder Flexion  5/5    Right Shoulder Extension  5/5    Right Shoulder ABduction  4+/5    Right Shoulder Internal Rotation  5/5    Right Shoulder External Rotation  5/5    Left Shoulder Flexion  5/5    Left Shoulder Extension  5/5    Left Shoulder ABduction  4/5    Left Shoulder Internal Rotation  4/5    Left Shoulder External Rotation  4/5      Palpation   Palpation comment  TTP in L sub-acromial space and over L middle deltoid, muscle tightness noted over L upper trap, scapular winging noted on L side compared to right       Special Tests    Special Tests  Rotator Cuff Impingement;Laxity/Instability Tests;Biceps/Labral Tests    Rotator Cuff Impingment tests  Leanord AsalHawkins- Kennedy test;Drop Arm test;Empty Can test;Full Can test      Hawkins-Kennedy test   Findings  Positive    Side  Left    Comments  negative on Rt      Empty Can test   Findings  Negative    Side  Left    Comment  all negative on the Rt      Full Can test   Findings  Negative    Side  Left    Comment  also negative on Rt      Drop Arm test   Findings  Negative    Side  Left    Comment  also negative on Rt        Objective measurements completed on examination: See above findings.     PT Education - 03/11/17 1532    Education provided  Yes    Education Details  Pt. educated on HEP, POC, and relevant exam findings     Person(s) Educated  Patient    Methods  Explanation;Demonstration;Handout     Comprehension  Verbalized understanding       PT Short Term Goals - 03/11/17 1636      PT SHORT TERM GOAL #1   Title  Pt. will be independent with I HEP     Time  4    Period  Weeks    Status  New    Target Date  04/08/17      PT SHORT TERM GOAL #2   Title  Pt. will demonstrate decreased scapular winging on Lt side to promote proper glenohumeral rhythm for progression towards LTG.    Time  4    Period  Weeks    Status  New    Target Date  04/08/17        PT Long Term Goals - 03/11/17 1637      PT LONG TERM GOAL #1   Title  Pt. will maintain shoulder AROM WFL bilaterally without pain for ability to return to gym activities.    Time  8    Period  Weeks    Status  New    Target Date  05/06/17      PT LONG TERM GOAL #2   Title  Pt. will improve overall L shoulder strength to 5/5 to match R shoulder and prevent compensation.     Time  8    Period  Weeks    Status  New    Target Date  05/06/17      PT LONG TERM GOAL #3   Title  Pt. will maintain decreased scapular winging to be able to withstand repeative overhead lifting with </= 1/10 pain for ability to increase work related function.    Time  8    Period  Weeks    Status  New    Target Date  05/06/17      PT LONG TERM GOAL #4   Title  Pt. will be independent with all prescribed HEP as of final visit.    Time  8    Period  Weeks    Status  New    Target Date  05/06/17      PT LONG TERM GOAL #5   Title  Pt. will improve FOTO score to </= 23% limited.    Time  8    Period  Weeks    Status  New    Target Date  05/06/17        Plan - 03/11/17 1535    Clinical Impression Statement  Pt. is 41 yo male presenting to OPPT with CC of bilateral shoulder pain. Pt. displays AROM WFL with strength deficit with shoulder abduction. Pt. was TTP in left sub-acromial space and over L middle deltoid. Pt. had significant scapular winging on L side as compared to the R with noted muscle tightness in L upper trapezius. Pt. will  benefit from physical therapy for addressing pain to attempt return to baseline function.    Clinical Presentation  Stable    Clinical Decision Making  Low    Rehab Potential  Good    PT Frequency  1x / week    PT Duration  8 weeks    PT Treatment/Interventions  Cryotherapy;Electrical Stimulation;Iontophoresis 4mg /ml Dexamethasone;Moist Heat;Ultrasound;Therapeutic activities;Therapeutic exercise;Neuromuscular re-education;Patient/family education;Manual techniques;Taping;Dry needling    PT Next Visit Plan  Review HEP and progress as needed; shoulder strengthening; upper trap stretching; inhibition taping PRN; scapular stabilization exercise     PT Home Exercise Plan  ceiling punches, push up plus, upper trap stretch, pec stretch        Patient will benefit from skilled therapeutic intervention in order to improve the following deficits and impairments:  Pain, Impaired UE functional use, Decreased strength, Postural dysfunction  Visit Diagnosis: Chronic left shoulder pain  Chronic right shoulder pain  Abnormal posture     Problem List Patient Active Problem List   Diagnosis Date Noted  . 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 Routine general medical examination at a health care facility 08/29/2013  . Low back pain at multiple sites 06/28/2013  . 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 ALLERGIC RHINITIS CAUSE UNSPECIFIED 06/01/2009     Maryruth Hancock, SPT 03/12/17 12:15 PM    [facility] Outpatient Rehabilitation [facility] [address] [place], [place], [address] Phone: [phone]   Fax:  [phone]  Name: Derek [name] MRN: [id number] Date of Birth: [birth year]

## 2017-03-18 ENCOUNTER — Ambulatory Visit: Payer: Commercial Managed Care - PPO | Admitting: Physical Therapy

## 2017-03-18 DIAGNOSIS — R293 Abnormal posture: Secondary | ICD-10-CM

## 2017-03-18 DIAGNOSIS — G8929 Other chronic pain: Secondary | ICD-10-CM

## 2017-03-18 DIAGNOSIS — M25512 Pain in left shoulder: Secondary | ICD-10-CM | POA: Diagnosis not present

## 2017-03-18 DIAGNOSIS — M25511 Pain in right shoulder: Secondary | ICD-10-CM

## 2017-03-19 NOTE — Therapy (Signed)
[facility] [address] [place], [place], [address] Phone: [phone]   Fax:  [phone]  Physical Therapy Treatment  Patient Details  Name: Derek [name] J Cincotta MRN: [id number] Date of Birth: 09/23/1975 Referring Provider: Etta Grandchildhomas L Jones, MD   Encounter Date: 03/18/2017    Past Medical History:  Diagnosis Date  . ALLERGIC RHINITIS CAUSE UNSPECIFIED 06/01/2009  . Complication of anesthesia    family history- taken longer to go asleep  . No pertinent past medical history     Past Surgical History:  Procedure Laterality Date  . HERNIA REPAIR    . INGUINAL HERNIA REPAIR  06/07/2012   Procedure: LAPAROSCOPIC BILATERAL INGUINAL HERNIA REPAIR;  Surgeon: Shelly Rubensteinouglas A Blackman, MD;  Location: [facility];  Service: General;  Laterality: Bilateral;  . INSERTION OF MESH  06/07/2012   Procedure: INSERTION OF MESH;  Surgeon: Shelly Rubensteinouglas A Blackman, MD;  Location: [facility];  Service: General;  Laterality: Bilateral;  . wisdom teeth removal      There were no vitals filed for this visit.  Subjective Assessment - 03/19/17 1507    Subjective  Reviewed higer level scpaular strengthening exercises. patient had no increase in pain. He was educated on symptom mangement.     How long can you sit comfortably?  unlimited     How long can you stand comfortably?  unlimited     Diagnostic tests  no     Patient Stated Goals  feel better, figure out problem with shoulder, figure out activiteis to limit back and neck pain     Currently in Pain?  Yes    Pain Score  3     Pain Location  Shoulder    Pain Orientation  Right;Left    Pain Descriptors / Indicators  Aching;Dull    Pain Type  Chronic pain    Pain Radiating Towards  localized to deltoid     Aggravating Factors   overhead motions     Pain Relieving Factors  rest     Multiple Pain Sites  No                      [facility] Adult PT Treatment/Exercise - 03/19/17 0001      Lumbar Exercises:  Quadruped   Other Quadruped Lumbar Exercises  alternatiung UE/LE     Other Quadruped Lumbar Exercises  plank push-up plaus x10; high plank push up plus       Shoulder Exercises: Supine   Protraction Limitations  10 lbs 3x10       Shoulder Exercises: Sidelying   External Rotation Limitations  3x10 ER 3lb       Shoulder Exercises: Standing   Extension Limitations  2x10 blue     Row Limitations  2x10 blue     Other Standing Exercises  Standing wall clock yellow 1-3-6 5x each arm; standoing wall walk 3 laps x5 yellow      Shoulder Exercises: Stretch   Corner Stretch Limitations  2x30 sec hold              PT Education - 03/19/17 1510    Education provided  Yes    Education Details  Reviewed HEP, updated HEP; theory behind strengthening     Person(s) Educated  Patient    Methods  Explanation;Demonstration;Tactile cues;Verbal cues;Handout    Comprehension  Verbalized understanding;Returned demonstration;Tactile cues required;Verbal cues required       PT Short Term Goals - 03/11/17 1636  PT SHORT TERM GOAL #1   Title  Pt. will be independent with I HEP     Time  4    Period  Weeks    Status  New    Target Date  04/08/17      PT SHORT TERM GOAL #2   Title  Pt. will demonstrate decreased scapular winging on Lt side to promote proper glenohumeral rhythm for progression towards LTG.    Time  4    Period  Weeks    Status  New    Target Date  04/08/17        PT Long Term Goals - 03/11/17 1637      PT LONG TERM GOAL #1   Title  Pt. will maintain shoulder AROM WFL bilaterally without pain for ability to return to gym activities.    Time  8    Period  Weeks    Status  New    Target Date  05/06/17      PT LONG TERM GOAL #2   Title  Pt. will improve overall L shoulder strength to 5/5 to match R shoulder and prevent compensation.     Time  8    Period  Weeks    Status  New    Target Date  05/06/17      PT LONG TERM GOAL #3   Title  Pt. will maintain decreased  scapular winging to be able to withstand repeative overhead lifting with </= 1/10 pain for ability to increase work related function.    Time  8    Period  Weeks    Status  New    Target Date  05/06/17      PT LONG TERM GOAL #4   Title  Pt. will be independent with all prescribed HEP as of final visit.    Time  8    Period  Weeks    Status  New    Target Date  05/06/17      PT LONG TERM GOAL #5   Title  Pt. will improve FOTO score to </= 23% limited.    Time  8    Period  Weeks    Status  New    Target Date  05/06/17            Plan - 03/19/17 1512    Clinical Impression Statement  Patient tolerated treatment well. he was given higher level strengthening. He was advised to stick with strengthening given right now so therapy can tell how he reacts. Therapy will asvance him to Gym exercises as tolerated. if patient is sore consdier manual therapy. Also consdier scpaular guidence. with forward flexion or side lying flexion     Clinical Presentation  Stable    Clinical Decision Making  Low    Rehab Potential  Good    PT Frequency  1x / week    PT Duration  8 weeks    PT Treatment/Interventions  Cryotherapy;Electrical Stimulation;Iontophoresis 4mg /ml Dexamethasone;Moist Heat;Ultrasound;Therapeutic activities;Therapeutic exercise;Neuromuscular re-education;Patient/family education;Manual techniques;Taping;Dry needling    PT Next Visit Plan  Review HEP and progress as needed; shoulder strengthening; upper trap stretching; inhibition taping PRN; scapular stabilization exercise ; progress exercises as tolerated    PT Home Exercise Plan  ceiling punches, push up plus, upper trap stretch, pec stretch     Consulted and Agree with Plan of Care  Patient       Patient will benefit from skilled therapeutic intervention in order to improve the  following deficits and impairments:  Pain, Impaired UE functional use, Decreased strength, Postural dysfunction  Visit Diagnosis: Chronic left  shoulder pain  Chronic right shoulder pain  Abnormal posture     Problem List Patient Active Problem List   Diagnosis Date Noted  . 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 Routine general medical examination at a health care facility 08/29/2013  . Low back pain at multiple sites 06/28/2013  . 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 ALLERGIC RHINITIS CAUSE UNSPECIFIED 06/01/2009    Dessie Coma PT DPT  03/19/2017, 3:16 PM  [facility] [address] [place], [place], [address] Phone: [phone]   Fax:  [phone]  Name: TYGE SOMERS MRN: [id number] Date of Birth: 1975-10-03

## 2017-04-01 ENCOUNTER — Ambulatory Visit: Payer: Commercial Managed Care - PPO | Admitting: Physical Therapy

## 2017-04-01 ENCOUNTER — Encounter: Payer: Self-pay | Admitting: Physical Therapy

## 2017-04-01 DIAGNOSIS — R293 Abnormal posture: Secondary | ICD-10-CM

## 2017-04-01 DIAGNOSIS — G8929 Other chronic pain: Secondary | ICD-10-CM

## 2017-04-01 DIAGNOSIS — M25511 Pain in right shoulder: Secondary | ICD-10-CM

## 2017-04-01 DIAGNOSIS — M25512 Pain in left shoulder: Secondary | ICD-10-CM | POA: Diagnosis not present

## 2017-04-01 NOTE — Patient Instructions (Addendum)
Sleeping on Back  Place pillow under knees. A pillow with cervical support and a roll around waist are also helpful. Copyright  V[employer]. All rights reserved.  Sleeping on Side Place pillow between knees. Use cervical support under neck and a roll around waist as needed. Copyright  VHI. All rights reserved.   Sleeping on Stomach   If this is the only desirable sleeping position, place pillow under lower legs, and under stomach or chest as needed.  Posture - Sitting   Sit upright, head facing forward. Try using a roll to support lower back. Keep shoulders relaxed, and avoid rounded back. Keep hips level with knees. Avoid crossing legs for long periods. Stand to Sit / Sit to Stand   To sit: Bend knees to lower self onto front edge of chair, then scoot back on seat. To stand: Reverse sequence by placing one foot forward, and scoot to front of seat. Use rocking motion to stand up.   Work Height and Reach  Ideal work height is no more than 2 to 4 inches below elbow level when standing, and at elbow level when sitting. Reaching should be limited to arm's length, with elbows slightly bent.  Bending  Bend at hips and knees, not back. Keep feet shoulder-width apart.    Posture - Standing   Good posture is important. Avoid slouching and forward head thrust. Maintain curve in low back and align ears over shoul- ders, hips over ankles.  Alternating Positions   Alternate tasks and change positions frequently to reduce fatigue and muscle tension. Take rest breaks. Computer Work   Position work to face forward. Use proper work and seat height. Keep shoulders back and down, wrists straight, and elbows at right angles. Use chair that provides full back support. Add footrest and lumbar roll as needed.  Getting Into / Out of Car  Lower self onto seat, scoot back, then bring in one leg at a time. Reverse sequence to get out.  Dressing  Lie on back to pull socks or slacks over feet, or sit  and bend leg while keeping back straight.    Housework - Sink  Place one foot on ledge of cabinet under sink when standing at sink for prolonged periods.   Pushing / Pulling  Pushing is preferable to pulling. Keep back in proper alignment, and use leg muscles to do the work.  Deep Squat   Squat and lift with both arms held against upper trunk. Tighten stomach muscles without holding breath. Use smooth movements to avoid jerking.  Avoid Twisting   Avoid twisting or bending back. Pivot around using foot movements, and bend at knees if needed when reaching for articles.  Carrying Luggage   Distribute weight evenly on both sides. Use a cart whenever possible. Do not twist trunk. Move body as a unit.   Lifting Principles .Maintain proper posture and head alignment. .Slide object as close as possible before lifting. .Move obstacles out of the way. .Test before lifting; ask for help if too heavy. .Tighten stomach muscles without holding breath. .Use smooth movements; do not jerk. .Use legs to do the work, and pivot with feet. .Distribute the work load symmetrically and close to the center of trunk. .Push instead of pull whenever possible.   Ask For Help   Ask for help and delegate to others when possible. Coordinate your movements when lifting together, and maintain the low back curve.  Log Roll   Lying on back, bend left knee and place left   arm across chest. Roll all in one movement to the right. Reverse to roll to the left. Always move as one unit. Housework - Sweeping  Use long-handled equipment to avoid stooping.   Housework - Wiping  Position yourself as close as possible to reach work surface. Avoid straining your back.  Laundry - Unloading Wash   To unload small items at bottom of washer, lift leg opposite to arm being used to reach.  Gardening - Raking  Move close to area to be raked. Use arm movements to do the work. Keep back straight and avoid  twisting.     Cart  When reaching into cart with one arm, lift opposite leg to keep back straight.   Getting Into / Out of Bed  Lower self to lie down on one side by raising legs and lowering head at the same time. Use arms to assist moving without twisting. Bend both knees to roll onto back if desired. To sit up, start from lying on side, and use same move-ments in reverse. Housework - Vacuuming  Hold the vacuum with arm held at side. Step back and forth to move it, keeping head up. Avoid twisting.   Laundry - Armed forces training and education officerLoading Wash  Position laundry basket so that bending and twisting can be avoided.   Laundry - Unloading Dryer  Squat down to reach into clothes dryer or use a reacher.  Gardening - Weeding / Psychiatric nurselanting  Squat or Kneel. Knee pads may be helpful.                  Over Head Pull: Narrow Grip       On back, knees bent, feet flat, band across thighs, elbows straight but relaxed. Pull hands apart (start). Keeping elbows straight, bring arms up and over head, hands toward floor. Keep pull steady on band. Hold momentarily. Return slowly, keeping pull steady, back to start. Repeat 10-20___ times. Band color _Green_____   Side Pull: Double Arm   On back, knees bent, feet flat. Arms perpendicular to body, shoulder level, elbows straight but relaxed. Pull arms out to sides, elbows straight. Resistance band comes across collarbones, hands toward floor. Hold momentarily. Slowly return to starting position. Repeat _10-20 __ times. Band color _Green____   Sash   On back, knees bent, feet flat, left hand on left hip, right hand above left. Pull right arm DIAGONALLY (hip to shoulder) across chest. Bring right arm along head toward floor. Hold momentarily. Slowly return to starting position. Repeat _10-20__ times. Do with left arm. Band color ___Green___   Shoulder Rotation: Double Arm   On back, knees bent, feet flat, elbows tucked at sides, bent 90, hands palms up.  Pull hands apart and down toward floor, keeping elbows near sides. Hold momentarily. Slowly return to starting position. Repeat 10-20___ times. Band color __Green____

## 2017-04-01 NOTE — Therapy (Signed)
[facility] [address] [place], [place], [address] Phone: [phone]   Fax:  [phone]  Physical Therapy Treatment  Patient Details  Name: Derek [name] Mccon[place] MRN: [id number] Date of Birth: [birth year] Referring Provider: Etta Grandchildhomas L Jones, MD   Encounter Date: 04/01/2017  PT End of Session - 04/01/17 1816    Visit Number  2    Number of Visits  8    Date for PT Re-Evaluation  04/08/17    PT Start Time  1504    PT Stop Time  1545    PT Time Calculation (min)  41 min    Activity Tolerance  Patient tolerated treatment well    Behavior During Therapy  [facility] for tasks assessed/performed       Past Medical History:  Diagnosis Date  . ALLERGIC RHINITIS CAUSE UNSPECIFIED 06/01/2009  . Complication of anesthesia    family history- taken longer to go asleep  . No pertinent past medical history     Past Surgical History:  Procedure Laterality Date  . HERNIA REPAIR    . INGUINAL HERNIA REPAIR  06/07/2012   Procedure: LAPAROSCOPIC BILATERAL INGUINAL HERNIA REPAIR;  Surgeon: Shelly Rubensteinouglas A Blackman, MD;  Location: [facility];  Service: General;  Laterality: Bilateral;  . INSERTION OF MESH  06/07/2012   Procedure: INSERTION OF MESH;  Surgeon: Shelly Rubensteinouglas A Blackman, MD;  Location: [facility];  Service: General;  Laterality: Bilateral;  . wisdom teeth removal      There were no vitals filed for this visit.  Subjective Assessment - 04/01/17 1812    Subjective  Doing OK the pain is improving.  i have been working on posture.  I need another way to stretch pecs at door.  Arms go a little numb.    Currently in Pain?  No/denies    Pain Score  3     Pain Location  Shoulder    Pain Orientation  Right;Left    Pain Descriptors / Indicators  Aching;Dull;Numbness    Pain Type  Chronic pain    Pain Radiating Towards  localized to deltoid,  posterior  with wrap to lateral    Pain Frequency  Intermittent    Aggravating Factors   overhead moitins,  abduction    Pain Relieving Factors  rest    Multiple Pain Sites  No                      [facility] Adult PT Treatment/Exercise - 04/01/17 0001      Self-Care   Self-Care  -- Issued ADL posture handouts.  Did not review      Lumbar Exercises: Standing   Other Standing Lumbar Exercises  High plank push up 10 x at counter  able to do 10 x,  From mat difficult per patient.      Lumbar Exercises: Quadruped   Other Quadruped Lumbar Exercises  alternating UE/LE  cued to keep legs lower to prevent back strain.       Shoulder Exercises: Supine   Other Supine Exercises  supine scap stab series,  green band 10 -20 x each,  minor cues needed.        Shoulder Exercises: Stretch   Other Shoulder Stretches  Upper trap,  levator stretch 1 rep 10-15 seconds each side.    Other Shoulder Stretches  Single arm door way stretch mod cues patient was getting arm numbness with double arms  PT Education - 04/01/17 1544    Education provided  Yes    Education Details  HEP,  ADL handout    Person(s) Educated  Patient    Methods  Explanation;Demonstration;Verbal cues;Handout;Tactile cues    Comprehension  Verbalized understanding;Returned demonstration       PT Short Term Goals - 03/11/17 1636      PT SHORT TERM GOAL #1   Title  Pt. will be independent with I HEP     Time  4    Period  Weeks    Status  New    Target Date  04/08/17      PT SHORT TERM GOAL #2   Title  Pt. will demonstrate decreased scapular winging on Lt side to promote proper glenohumeral rhythm for progression towards LTG.    Time  4    Period  Weeks    Status  New    Target Date  04/08/17        PT Long Term Goals - 03/11/17 1637      PT LONG TERM GOAL #1   Title  Pt. will maintain shoulder AROM WFL bilaterally without pain for ability to return to gym activities.    Time  8    Period  Weeks    Status  New    Target Date  05/06/17      PT LONG TERM GOAL #2   Title  Pt. will improve overall L  shoulder strength to 5/5 to match R shoulder and prevent compensation.     Time  8    Period  Weeks    Status  New    Target Date  05/06/17      PT LONG TERM GOAL #3   Title  Pt. will maintain decreased scapular winging to be able to withstand repeative overhead lifting with </= 1/10 pain for ability to increase work related function.    Time  8    Period  Weeks    Status  New    Target Date  05/06/17      PT LONG TERM GOAL #4   Title  Pt. will be independent with all prescribed HEP as of final visit.    Time  8    Period  Weeks    Status  New    Target Date  05/06/17      PT LONG TERM GOAL #5   Title  Pt. will improve FOTO score to </= 23% limited.    Time  8    Period  Weeks    Status  New    Target Date  05/06/17            Plan - 04/01/17 1816    Clinical Impression Statement  Pregressed his HEP.  Pain in shoulder is now intermitant.  Patient is scheduled to return in 2 weeks.  He focus on HEP until then.  No pain increased at end of session.    PT Next Visit Plan  Review HEP and progress as needed; shoulder strengthening; upper trap stretching; inhibition taping PRN; scapular stabilization exercise ; progress exercises as tolerated    PT Home Exercise Plan  ceiling punches, push up plus, upper trap stretch, pec stretch ,  single arm doorway,  supine scapular stab    Consulted and Agree with Plan of Care  Patient       Patient will benefit from skilled therapeutic intervention in order to improve the following deficits and impairments:  Visit Diagnosis: Chronic left shoulder pain  Chronic right shoulder pain  Abnormal posture     Problem List Patient Active Problem List   Diagnosis Date Noted  . 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 Routine general medical examination at a health care facility 08/29/2013  . Low back pain at multiple sites 06/28/2013  . 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 ALLERGIC RHINITIS CAUSE UNSPECIFIED 06/01/2009    Shonteria Abeln PTA 04/01/2017, 6:18 PM  [facility] [address] [place], [place], [address] Phone: [phone]   Fax:  [phone]  Name: Derek [name] Wismer MRN: [id number] Date of Birth: 1975-10-11

## 2017-04-08 ENCOUNTER — Encounter: Payer: Commercial Managed Care - PPO | Admitting: Physical Therapy

## 2017-04-15 ENCOUNTER — Encounter: Payer: Commercial Managed Care - PPO | Admitting: Physical Therapy

## 2017-04-29 ENCOUNTER — Ambulatory Visit: Payer: Commercial Managed Care - PPO | Attending: Internal Medicine | Admitting: Physical Therapy

## 2017-04-29 DIAGNOSIS — R293 Abnormal posture: Secondary | ICD-10-CM | POA: Insufficient documentation

## 2017-04-29 DIAGNOSIS — M25512 Pain in left shoulder: Secondary | ICD-10-CM | POA: Insufficient documentation

## 2017-04-29 DIAGNOSIS — M25511 Pain in right shoulder: Secondary | ICD-10-CM | POA: Insufficient documentation

## 2017-04-29 DIAGNOSIS — G8929 Other chronic pain: Secondary | ICD-10-CM | POA: Insufficient documentation

## 2017-04-30 ENCOUNTER — Encounter: Payer: Self-pay | Admitting: Physical Therapy

## 2017-04-30 NOTE — Therapy (Addendum)
[place], [place], [address] Phone: [phone]   Fax:  [phone]  Physical Therapy Treatment / Discharge Summary  Patient Details  Name: Derek [name] MRN: [id number] Date of Birth: [birth year] Referring Provider: Janith Lima, MD   Encounter Date: 04/29/2017  PT End of Session - 04/30/17 1040    Visit Number  3    Number of Visits  8    Date for PT Re-Evaluation  04/08/17    PT Start Time  1630    PT Stop Time  1710    PT Time Calculation (min)  40 min    Activity Tolerance  Patient tolerated treatment well    Behavior During Therapy  [facility] for tasks assessed/performed       Past Medical History:  Diagnosis Date  . ALLERGIC RHINITIS CAUSE UNSPECIFIED 06/01/2009  . Complication of anesthesia    family history- taken longer to go asleep  . No pertinent past medical history     Past Surgical History:  Procedure Laterality Date  . HERNIA REPAIR    . INGUINAL HERNIA REPAIR  06/07/2012   Procedure: LAPAROSCOPIC BILATERAL INGUINAL HERNIA REPAIR;  Surgeon: Harl Bowie, MD;  Location: [facility];  Service: General;  Laterality: Bilateral;  . INSERTION OF MESH  06/07/2012   Procedure: INSERTION OF MESH;  Surgeon: Harl Bowie, MD;  Location: [facility];  Service: General;  Laterality: Bilateral;  . wisdom teeth removal      There were no vitals filed for this visit.  Subjective Assessment - 04/30/17 1038    Subjective  Patient reports just minor discmofrot at times. He is doing his exercises.     How long can you sit comfortably?  unlimited     How long can you stand comfortably?  unlimited     How long can you walk comfortably?  unlimited     Diagnostic tests  no     Patient Stated Goals  feel better, figure out problem with shoulder, figure out activiteis to limit back and neck pain     Currently in Pain?  No/denies    Pain Location  Shoulder    Pain Orientation  Right;Left    Pain  Descriptors / Indicators  Aching;Dull    Pain Type  Chronic pain    Pain Radiating Towards  pain localized to the deltoid, posterior with wrap     Pain Frequency  Intermittent    Aggravating Factors   overead motions     Pain Relieving Factors  rest                      [facility] Adult PT Treatment/Exercise - 04/30/17 0001      Lumbar Exercises: Quadruped   Other Quadruped Lumbar Exercises  alternating UE/LE  cued to keep legs lower to prevent back strain.     Other Quadruped Lumbar Exercises  reviewed altr ue with weight and threading the needle; reviewed progression for high plank.       Shoulder Exercises: Seated   Other Seated Exercises  shoulder press 2x10 8 lb;       Shoulder Exercises: Standing   Other Standing Exercises  standing shoulder  flexion 2x10 2lb shoulder Y 2x10 2lb       Shoulder Exercises: Power Futures trader  row 25 lb x20 each; lat pull down x20 standing x25lb;  PT Education - 04/30/17 1040    Education provided  Yes    Education Details  HEP, ADL's handout     Person(s) Educated  Patient    Methods  Explanation;Demonstration;Tactile cues;Verbal cues    Comprehension  Verbalized understanding;Verbal cues required;Returned demonstration;Tactile cues required       PT Short Term Goals - 04/30/17 1051      PT SHORT TERM GOAL #1   Title  Pt. will be independent with I HEP     Time  4    Period  Weeks    Status  On-going      PT SHORT TERM GOAL #2   Title  Pt. will demonstrate decreased scapular winging on Lt side to promote proper glenohumeral rhythm for progression towards LTG.    Time  4    Period  Weeks    Status  On-going        PT Long Term Goals - 03/11/17 1637      PT LONG TERM GOAL #1   Title  Pt. will maintain shoulder AROM WFL bilaterally without pain for ability to return to gym activities.    Time  8    Period  Weeks    Status  New    Target Date  05/06/17      PT LONG TERM  GOAL #2   Title  Pt. will improve overall L shoulder strength to 5/5 to match R shoulder and prevent compensation.     Time  8    Period  Weeks    Status  New    Target Date  05/06/17      PT LONG TERM GOAL #3   Title  Pt. will maintain decreased scapular winging to be able to withstand repeative overhead lifting with </= 1/10 pain for ability to increase work related function.    Time  8    Period  Weeks    Status  New    Target Date  05/06/17      PT LONG TERM GOAL #4   Title  Pt. will be independent with all prescribed HEP as of final visit.    Time  8    Period  Weeks    Status  New    Target Date  05/06/17      PT LONG TERM GOAL #5   Title  Pt. will improve FOTO score to </= 23% limited.    Time  8    Period  Weeks    Status  New    Target Date  05/06/17            Plan - 04/30/17 1043    Clinical Impression Statement  Patient is making good progress at this time. he feels comfortablre with his exercises. Therapy reviewed light gym exercises and progression to higher level body support exercises. He will work on his exercises for the next few weeks. If he needs a visit he will schedule it. If he dosent we will give it 3-4 weeks then D/C to HEP.     Clinical Presentation  Stable    Clinical Decision Making  Low    Rehab Potential  Good    PT Frequency  1x / week    PT Duration  8 weeks    PT Treatment/Interventions  Cryotherapy;Electrical Stimulation;Iontophoresis 54m/ml Dexamethasone;Moist Heat;Ultrasound;Therapeutic activities;Therapeutic exercise;Neuromuscular re-education;Patient/family education;Manual techniques;Taping;Dry needling    PT Next Visit Plan  Review HEP and progress as needed; shoulder strengthening; upper trap stretching; inhibition  taping PRN; scapular stabilization exercise ; progress exercises as tolerated    PT Home Exercise Plan  ceiling punches, push up plus, upper trap stretch, pec stretch ,  single arm doorway,  supine scapular stab     Consulted and Agree with Plan of Care  Patient       Patient will benefit from skilled therapeutic intervention in order to improve the following deficits and impairments:  Pain, Impaired UE functional use, Decreased strength, Postural dysfunction  Visit Diagnosis: Chronic left shoulder pain  Chronic right shoulder pain  Abnormal posture     Problem List Patient Active Problem List   Diagnosis Date Noted  . 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 Routine general medical examination at a health care facility 08/29/2013  . Low back pain at multiple sites 06/28/2013  . 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 ALLERGIC RHINITIS CAUSE UNSPECIFIED 06/01/2009    Carney Living PT DPT  04/30/2017, 10:52 AM  [facility] [address] [place], [place], [address] Phone: [phone]   Fax:  [phone]  Name: ARMONTE TORTORELLA MRN: [id number] Date of Birth: Mar 27, 1976       PHYSICAL THERAPY DISCHARGE SUMMARY  Visits from Start of Care: 3  Current functional level related to goals / functional outcomes: See goals   Remaining deficits: unknown   Education / Equipment: HEP  Plan: Patient agrees to discharge.  Patient goals were not met. Patient is being discharged due to not returning since the last visit.  ?????         Kristoffer Leamon PT, DPT, LAT, ATC  06/08/17  2:52 PM

## 2017-10-09 ENCOUNTER — Encounter: Payer: Self-pay | Admitting: Internal Medicine

## 2017-10-09 ENCOUNTER — Ambulatory Visit: Payer: Commercial Managed Care - PPO | Admitting: Internal Medicine

## 2017-10-09 VITALS — BP 110/76 | HR 60 | Ht 72.0 in | Wt 159.0 lb

## 2017-10-09 DIAGNOSIS — L290 Pruritus ani: Secondary | ICD-10-CM | POA: Diagnosis not present

## 2017-10-09 MED ORDER — LIDOCAINE-HYDROCORTISONE ACE 3-0.5 % RE CREA: 1.0000 | TOPICAL_CREAM | Freq: Two times a day (BID) | RECTAL | 0 refills | Status: DC

## 2017-10-09 NOTE — Progress Notes (Signed)
   Subjective:    Patient ID: Derek [name], male    DOB: 12/09/1975, 42 y.o.   MRN: [id number]  HPI  Here with c/o anal itching mild to mod gradually worsening x 10 days; has primarily Itching, no pain or blood.  Started Last tues x 10 days , tried prep H and witchhazel, not much help, overall improved in last few days but still quite bothersome.  Denies worsening reflux, abd pain, dysphagia, n/v, bowel change or blood. Past Medical History:  Diagnosis Date  . ALLERGIC RHINITIS CAUSE UNSPECIFIED 06/01/2009  . Complication of anesthesia    family history- taken longer to go asleep  . No pertinent past medical history    Past Surgical History:  Procedure Laterality Date  . HERNIA REPAIR    . INGUINAL HERNIA REPAIR  06/07/2012   Procedure: LAPAROSCOPIC BILATERAL INGUINAL HERNIA REPAIR;  Surgeon: Shelly Rubensteinouglas A Blackman, MD;  Location: [facility];  Service: General;  Laterality: Bilateral;  . INSERTION OF MESH  06/07/2012   Procedure: INSERTION OF MESH;  Surgeon: Shelly Rubensteinouglas A Blackman, MD;  Location: [facility];  Service: General;  Laterality: Bilateral;  . wisdom teeth removal      reports that he has never smoked. He has never used smokeless tobacco. He reports that he does not drink alcohol or use drugs. family history includes Arthritis in his other; Hyperlipidemia in his other; Hypertension in his other. No Known Allergies No current outpatient medications on file prior to visit.   No current facility-administered medications on file prior to visit.     Review of Systems All otherwise neg per pt    Objective:   Physical Exam BP 110/76 (BP Location: Left Arm, Patient Position: Sitting, Cuff Size: Normal)   Pulse 60   Ht 6' (1.829 m)   Wt 159 lb (72.1 kg)   SpO2 98%   BMI 21.56 kg/m  VS noted,  Constitutional: Pt appears in NAD HENT: Head: NCAT.  Right Ear: External ear normal.  Left Ear: External ear normal.  Eyes: . Pupils are equal, round, and reactive to light. Conjunctivae and EOM are  normal Nose: without d/c or deformity Neck: Neck supple. Gross normal ROM Cardiovascular: Normal rate and regular rhythm.   Pulmonary/Chest: Effort normal and breath sounds without rales or wheezing.  Abd:  Soft, NT, ND, + BS, no organomegal Rectal exam with 1 cm perianal erythema nontender, no swelling or hemorrhoid, small skin tag at 6oclock noted, DRE not done Neurological: Pt is alert. At baseline orientation, motor grossly intact Skin: Skin is warm. No rashes, other new lesions, no LE edema Psychiatric: Pt behavior is normal without agitation         Assessment & Plan:

## 2017-10-09 NOTE — Patient Instructions (Addendum)
Please take all new medication as prescribed - the cream  Please call for referral to GI if not improved in 3-5 days  Please continue all other medications as before, and refills have been done if requested.  Please have the pharmacy call with any other refills you may need.  Please keep your appointments with your specialists as you may have planned

## 2017-10-09 NOTE — Assessment & Plan Note (Signed)
Mild, for anamantle HC asd,  to f/u any worsening symptoms or concerns

## 2017-10-19 ENCOUNTER — Encounter: Payer: Self-pay | Admitting: Internal Medicine

## 2019-05-17 ENCOUNTER — Ambulatory Visit: Payer: Commercial Managed Care - PPO | Attending: Internal Medicine

## 2019-05-17 DIAGNOSIS — Z20822 Contact with and (suspected) exposure to covid-19: Secondary | ICD-10-CM

## 2019-05-19 LAB — NOVEL CORONAVIRUS, NAA: SARS-CoV-2, NAA: NOT DETECTED

## 2020-02-22 ENCOUNTER — Encounter: Payer: Self-pay | Admitting: Internal Medicine

## 2020-02-22 ENCOUNTER — Ambulatory Visit (INDEPENDENT_AMBULATORY_CARE_PROVIDER_SITE_OTHER): Payer: Commercial Managed Care - PPO | Admitting: Internal Medicine

## 2020-02-22 ENCOUNTER — Other Ambulatory Visit: Payer: Self-pay

## 2020-02-22 VITALS — BP 126/84 | HR 63 | Temp 98.7°F | Resp 16 | Ht 72.0 in | Wt 154.0 lb

## 2020-02-22 DIAGNOSIS — Z23 Encounter for immunization: Secondary | ICD-10-CM | POA: Diagnosis not present

## 2020-02-22 DIAGNOSIS — R399 Unspecified symptoms and signs involving the genitourinary system: Secondary | ICD-10-CM | POA: Diagnosis not present

## 2020-02-22 DIAGNOSIS — R300 Dysuria: Secondary | ICD-10-CM | POA: Diagnosis not present

## 2020-02-22 DIAGNOSIS — N410 Acute prostatitis: Secondary | ICD-10-CM

## 2020-02-22 LAB — POCT URINALYSIS DIPSTICK
Bilirubin, UA: NEGATIVE
Blood, UA: NEGATIVE
Glucose, UA: NEGATIVE
Ketones, UA: NEGATIVE
Leukocytes, UA: NEGATIVE
Nitrite, UA: NEGATIVE
Protein, UA: NEGATIVE
Spec Grav, UA: 1.01 (ref 1.010–1.025)
Urobilinogen, UA: 0.2 E.U./dL
pH, UA: 7.5 (ref 5.0–8.0)

## 2020-02-22 MED ORDER — SULFAMETHOXAZOLE-TRIMETHOPRIM 800-160 MG PO TABS: 1.0000 | ORAL_TABLET | Freq: Two times a day (BID) | ORAL | 0 refills | Status: AC

## 2020-02-22 NOTE — Patient Instructions (Addendum)

## 2020-02-22 NOTE — Progress Notes (Signed)
Subjective:  Patient ID: Derek [name], male    DOB: 1975/12/03  Age: 44 y.o. MRN: [id number]  CC: Urinary Tract Infection  This visit occurred during the SARS-CoV-2 public health emergency.  Safety protocols were in place, including screening questions prior to the visit, additional usage of staff PPE, and extensive cleaning of exam room while observing appropriate contact time as indicated for disinfecting solutions.    HPI Derek [name] presents for the complaint of a 1 week hx of dysuria and urinary hesitancy.  Outpatient Medications Prior to Visit  Medication Sig Dispense Refill  . lidocaine-hydrocortisone (ANAMANTEL HC) 3-0.5 % CREA Place 1 Applicatorful rectally 2 (two) times daily. 28.3 g 0   No facility-administered medications prior to visit.    ROS Review of Systems  Constitutional: Negative.  Negative for chills, fatigue and fever.  HENT: Negative.  Negative for trouble swallowing.   Eyes: Negative.   Respiratory: Negative for cough, chest tightness, shortness of breath and wheezing.   Cardiovascular: Negative for chest pain, palpitations and leg swelling.  Gastrointestinal: Negative for abdominal pain, constipation, diarrhea, nausea and vomiting.  Endocrine: Negative.   Genitourinary: Positive for difficulty urinating and dysuria. Negative for decreased urine volume, flank pain, frequency, hematuria, scrotal swelling, testicular pain and urgency.  Musculoskeletal: Negative.  Negative for arthralgias and myalgias.  Skin: Negative.   Neurological: Negative.  Negative for dizziness, weakness and light-headedness.  Hematological: Negative for adenopathy. Does not bruise/bleed easily.  Psychiatric/Behavioral: Negative.     Objective:  BP 126/84   Pulse 63   Temp 98.7 F (37.1 C) (Oral)   Resp 16   Ht 6' (1.829 m)   Wt 154 lb (69.9 kg)   SpO2 98%   BMI 20.89 kg/m   BP Readings from Last 3 Encounters:  02/22/20 126/84  10/09/17 110/76  02/26/17 122/72     Wt Readings from Last 3 Encounters:  02/22/20 154 lb (69.9 kg)  10/09/17 159 lb (72.1 kg)  02/26/17 159 lb (72.1 kg)    Physical Exam Vitals reviewed.  HENT:     Nose: Nose normal.     Mouth/Throat:     Mouth: Mucous membranes are moist.  Eyes:     General: No scleral icterus.    Conjunctiva/sclera: Conjunctivae normal.  Cardiovascular:     Rate and Rhythm: Normal rate and regular rhythm.     Heart sounds: No murmur heard.   Pulmonary:     Effort: Pulmonary effort is normal.     Breath sounds: No stridor. No wheezing, rhonchi or rales.  Abdominal:     General: Abdomen is flat. Bowel sounds are normal. There is no distension.     Palpations: Abdomen is soft. There is no hepatomegaly, splenomegaly or mass.     Tenderness: There is no abdominal tenderness.     Hernia: There is no hernia in the left inguinal area or right inguinal area.  Genitourinary:    Pubic Area: No rash.      Penis: Normal and circumcised. No discharge, swelling or lesions.      Testes: Normal.        Right: Mass not present.        Left: Mass or swelling not present.     Epididymis:     Right: Normal. Not inflamed or enlarged. No mass.     Left: Normal. Not inflamed or enlarged. No mass.     Prostate: Enlarged. Not tender and no nodules present.     Rectum:  Guaiac result negative. External hemorrhoid and internal hemorrhoid present. No mass, tenderness or anal fissure. Normal anal tone.     Comments: The lateral edges of the prostate gland are slightly enlarged and boggy. Musculoskeletal:        General: Normal range of motion.     Cervical back: Neck supple.     Right lower leg: No edema.     Left lower leg: No edema.  Lymphadenopathy:     Cervical: No cervical adenopathy.     Lower Body: No right inguinal adenopathy. No left inguinal adenopathy.  Skin:    General: Skin is warm and dry.     Coloration: Skin is not pale.  Neurological:     General: No focal deficit present.     Mental  Status: He is alert.  Psychiatric:        Mood and Affect: Mood normal.        Behavior: Behavior normal.     Lab Results  Component Value Date   WBC 6.3 08/29/2013   HGB 14.3 08/29/2013   HCT 42.5 08/29/2013   PLT 173.0 08/29/2013   GLUCOSE 89 08/29/2013   CHOL 122 08/29/2013   TRIG 21.0 08/29/2013   HDL 59.00 08/29/2013   LDLCALC 59 08/29/2013   ALT 18 08/29/2013   AST 25 08/29/2013   NA 139 08/29/2013   K 5.1 08/29/2013   CL 105 08/29/2013   CREATININE 0.9 08/29/2013   BUN 8 08/29/2013   CO2 27 08/29/2013   TSH 1.85 08/29/2013    DG Lumbar Spine Complete  Result Date: 03/18/2016 CLINICAL DATA:  44 year old male with low back pain for 4 months. No known injury. Initial encounter. EXAM: LUMBAR SPINE - COMPLETE 4+ VIEW COMPARISON:  None. FINDINGS: Minimal curvature lumbar spine. Minimal L4-5 disc space narrowing. No pars defect detected. IMPRESSION: Minimal L4-5 disc space narrowing. Electronically Signed   By: Lacy Duverney M.D.   On: 03/18/2016 17:01    Assessment & Plan:   Derek [name] was seen today for urinary tract infection.  Diagnoses and all orders for this visit:  UTI symptoms -     POCT Urinalysis Dipstick  Dysuria -     CULTURE, URINE COMPREHENSIVE; Future -     CULTURE, URINE COMPREHENSIVE  Acute bacterial prostatitis- His UA is normal but he has signs and symptoms of acute bacterial prostatitis.  Urine culture is pending.  Will empirically treat with a 1 month course of Bactrim DS. -     sulfamethoxazole-trimethoprim (BACTRIM DS) 800-160 MG tablet; Take 1 tablet by mouth 2 (two) times daily.  Other orders -     Flu Vaccine QUAD 6+ mos PF IM (Fluarix Quad PF)   I have discontinued Derek [name] "Derek [name]"'s lidocaine-hydrocortisone. I am also having him start on sulfamethoxazole-trimethoprim.  Meds ordered this encounter  Medications  . sulfamethoxazole-trimethoprim (BACTRIM DS) 800-160 MG tablet    Sig: Take 1 tablet by mouth 2 (two) times daily.     Dispense:  60 tablet    Refill:  0     Follow-up: Return if symptoms worsen or fail to improve.  Sanda Linger, MD

## 2020-02-24 LAB — CULTURE, URINE COMPREHENSIVE: RESULT:: NO GROWTH

## 2020-04-21 ENCOUNTER — Ambulatory Visit: Payer: Commercial Managed Care - PPO | Attending: Internal Medicine

## 2020-04-21 DIAGNOSIS — Z23 Encounter for immunization: Secondary | ICD-10-CM

## 2020-04-21 NOTE — Progress Notes (Signed)
   Covid-19 Vaccination Clinic  Name:  Derek [name]    MRN: [id number] DOB: 02-24-1976  04/21/2020  Mr. Derek [name] was observed post Covid-19 immunization for 15 minutes without incident. He was provided with Vaccine Information Sheet and instruction to access the V-Safe system.   Mr. Derek [name] was instructed to call 911 with any severe reactions post vaccine: Marland Kitchen Difficulty breathing  . Swelling of face and throat  . A fast heartbeat  . A bad rash all over body  . Dizziness and weakness   Immunizations Administered    Name Date Dose VIS Date Route   Pfizer COVID-19 Vaccine 04/21/2020 10:09 AM 0.3 mL 02/22/2020 Intramuscular   Manufacturer: [employer], [employer]   Lot: [id number]   NDC: [address]-0

## 2020-06-11 ENCOUNTER — Emergency Department (HOSPITAL_COMMUNITY): Payer: Commercial Managed Care - PPO

## 2020-06-11 ENCOUNTER — Emergency Department (HOSPITAL_COMMUNITY)
Admission: EM | Admit: 2020-06-11 | Discharge: 2020-06-11 | Disposition: A | Discharge status: Home / Self Care | Payer: Commercial Managed Care - PPO | Attending: Emergency Medicine | Admitting: Emergency Medicine

## 2020-06-11 ENCOUNTER — Encounter (HOSPITAL_COMMUNITY): Payer: Self-pay

## 2020-06-11 ENCOUNTER — Other Ambulatory Visit: Payer: Self-pay

## 2020-06-11 DIAGNOSIS — N433 Hydrocele, unspecified: Secondary | ICD-10-CM | POA: Diagnosis not present

## 2020-06-11 DIAGNOSIS — R1031 Right lower quadrant pain: Secondary | ICD-10-CM | POA: Insufficient documentation

## 2020-06-11 DIAGNOSIS — Z20822 Contact with and (suspected) exposure to covid-19: Secondary | ICD-10-CM | POA: Diagnosis not present

## 2020-06-11 DIAGNOSIS — N50811 Right testicular pain: Secondary | ICD-10-CM

## 2020-06-11 DIAGNOSIS — N5082 Scrotal pain: Secondary | ICD-10-CM | POA: Diagnosis present

## 2020-06-11 LAB — CBC WITH DIFFERENTIAL/PLATELET
Abs Immature Granulocytes: 0.03 10*3/uL (ref 0.00–0.07)
Basophils Absolute: 0 10*3/uL (ref 0.0–0.1)
Basophils Relative: 0 %
Eosinophils Absolute: 0 10*3/uL (ref 0.0–0.5)
Eosinophils Relative: 0 %
HCT: 46.4 % (ref 39.0–52.0)
Hemoglobin: 15.5 g/dL (ref 13.0–17.0)
Immature Granulocytes: 0 %
Lymphocytes Relative: 11 %
Lymphs Abs: 1.2 10*3/uL (ref 0.7–4.0)
MCH: 29.5 pg (ref 26.0–34.0)
MCHC: 33.4 g/dL (ref 30.0–36.0)
MCV: 88.4 fL (ref 80.0–100.0)
Monocytes Absolute: 0.9 10*3/uL (ref 0.1–1.0)
Monocytes Relative: 9 %
Neutro Abs: 8.2 10*3/uL — ABNORMAL HIGH (ref 1.7–7.7)
Neutrophils Relative %: 80 %
Platelets: 160 10*3/uL (ref 150–400)
RBC: 5.25 MIL/uL (ref 4.22–5.81)
RDW: 12.9 % (ref 11.5–15.5)
WBC: 10.4 10*3/uL (ref 4.0–10.5)
nRBC: 0 % (ref 0.0–0.2)

## 2020-06-11 LAB — COMPREHENSIVE METABOLIC PANEL
ALT: 26 U/L (ref 0–44)
AST: 26 U/L (ref 15–41)
Albumin: 4.8 g/dL (ref 3.5–5.0)
Alkaline Phosphatase: 72 U/L (ref 38–126)
Anion gap: 9 (ref 5–15)
BUN: 10 mg/dL (ref 6–20)
CO2: 26 mmol/L (ref 22–32)
Calcium: 9.3 mg/dL (ref 8.9–10.3)
Chloride: 102 mmol/L (ref 98–111)
Creatinine, Ser: 1 mg/dL (ref 0.61–1.24)
GFR, Estimated: >60 mL/min (ref >60)
Glucose, Bld: 110 mg/dL — ABNORMAL HIGH (ref 70–99)
Potassium: 4.3 mmol/L (ref 3.5–5.1)
Sodium: 137 mmol/L (ref 135–145)
Total Bilirubin: 1.4 mg/dL — ABNORMAL HIGH (ref 0.3–1.2)
Total Protein: 7.5 g/dL (ref 6.5–8.1)

## 2020-06-11 LAB — URINALYSIS, ROUTINE W REFLEX MICROSCOPIC
Bilirubin Urine: NEGATIVE
Glucose, UA: NEGATIVE mg/dL
Hgb urine dipstick: NEGATIVE
Ketones, ur: 20 mg/dL — AB
Leukocytes,Ua: NEGATIVE
Nitrite: NEGATIVE
Protein, ur: NEGATIVE mg/dL
Specific Gravity, Urine: >1.046 — ABNORMAL HIGH (ref 1.005–1.030)
pH: 6 (ref 5.0–8.0)

## 2020-06-11 MED ORDER — HYDROCODONE-ACETAMINOPHEN 5-325 MG PO TABS: 1.0000 | ORAL_TABLET | Freq: Four times a day (QID) | ORAL | 0 refills | Status: DC | PRN

## 2020-06-11 MED ORDER — MORPHINE SULFATE (PF) 4 MG/ML IV SOLN
4.0000 mg | Freq: Once | INTRAVENOUS | Status: AC
  Administered 2020-06-11: 4 mg via INTRAVENOUS
  Filled 2020-06-11: qty 1

## 2020-06-11 MED ORDER — IOHEXOL 300 MG/ML  SOLN
100.0000 mL | Freq: Once | INTRAMUSCULAR | Status: AC | PRN
  Administered 2020-06-11: 100 mL via INTRAVENOUS

## 2020-06-11 MED ORDER — ONDANSETRON HCL 4 MG/2ML IJ SOLN
4.0000 mg | Freq: Once | INTRAMUSCULAR | Status: DC
  Filled 2020-06-11: qty 2

## 2020-06-11 NOTE — ED Notes (Signed)
US at bedside

## 2020-06-11 NOTE — ED Provider Notes (Signed)
[facility]-EMERGENCY DEPT Provider Note   CSN: [id number] Arrival date & time: 06/11/20  2376     History Chief Complaint  Patient presents with  . Testicle Pain    Derek [name] is a 45 y.o. male who presents for evaluation of right lower abdominal pain, right scrotal pain that has been ongoing since yesterday. He states that yesterday, he felt like he had to have a bowel movement and he states he strained very hard. He reports that after that, he felt pain in the lower pelvis/right scrotal area. He states that he thought he was constipated so he tried taking a laxative. He had a very small bowel movement earlier this morning but states that he had to push a lot. He states that the pain has been constant since yesterday. He feels like there is a knot right above his right testicle that is causing more pain. He states he feels like the pressure goes into his scrotum. He has had bilateral inguinal hernias previously. He has not any issues with them. He states he has not been passing flatus since yesterday. He denies any nausea/vomiting, fevers, dysuria, hematuria. He has not noted any redness or swelling of the testicle.  The history is provided by the patient.       Past Medical History:  Diagnosis Date  . ALLERGIC RHINITIS CAUSE UNSPECIFIED 06/01/2009  . Complication of anesthesia    family history- taken longer to go asleep  . No pertinent past medical history     Patient Active Problem List   Diagnosis Date Noted  . UTI symptoms 02/22/2020  . Dysuria 02/22/2020  . Acute bacterial prostatitis 02/22/2020  . Anal itching 10/09/2017  . 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 Routine general medical examination at a health care facility 08/29/2013  . Low back pain at multiple sites 06/28/2013  . 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 ALLERGIC RHINITIS CAUSE UNSPECIFIED 06/01/2009     Past Surgical History:  Procedure Laterality Date  . HERNIA REPAIR    . INGUINAL HERNIA REPAIR  06/07/2012   Procedure: LAPAROSCOPIC BILATERAL INGUINAL HERNIA REPAIR;  Surgeon: Shelly Rubenstein, MD;  Location: [facility];  Service: General;  Laterality: Bilateral;  . INSERTION OF MESH  06/07/2012   Procedure: INSERTION OF MESH;  Surgeon: Shelly Rubenstein, MD;  Location: [facility];  Service: General;  Laterality: Bilateral;  . wisdom teeth removal         Family History  Problem Relation Age of Onset  . Hypertension Mother   . Hypertension Father   . Arthritis Other   . Hypertension Other   . Hyperlipidemia Other     Social History   Tobacco Use  . Smoking status: Never Smoker  . Smokeless tobacco: Never Used  Vaping Use  . Vaping Use: Never used  Substance Use Topics  . Alcohol use: Not Currently    Comment: 1 beer a day  . Drug use: No    Home Medications Prior to Admission medications   Medication Sig Start Date End Date Taking? Authorizing Provider  HYDROcodone-acetaminophen (NORCO/VICODIN) 5-325 MG tablet Take 1-2 tablets by mouth every 6 (six) hours as needed. 06/11/20  Yes Maxwell Caul, PA-C  ibuprofen (ADVIL) 200 MG tablet Take 400 mg by mouth every 6 (six) hours as needed for fever, headache or mild pain.   Yes [provider]  Multiple Vitamin (MULTIVITAMIN WITH MINERALS)  TABS tablet Take 1 tablet by mouth daily.   Yes [provider]    Allergies    Patient has no known allergies.  Review of Systems   Review of Systems  Constitutional: Negative for fever.  Respiratory: Negative for shortness of breath.   Cardiovascular: Negative for chest pain.  Gastrointestinal: Positive for abdominal pain and constipation. Negative for nausea and vomiting.  Genitourinary: Positive for scrotal swelling and testicular pain. Negative for dysuria, hematuria, penile discharge, penile pain and penile swelling.  All other systems reviewed and are  negative.   Physical Exam Updated Vital Signs BP 118/66   Pulse 68   Temp (!) 100.4 F (38 C) (Oral)   Resp 14   Ht 6' (1.829 m)   Wt 70.3 kg   SpO2 100%   BMI 21.02 kg/m   Physical Exam Vitals and nursing note reviewed.  Constitutional:      Appearance: Normal appearance. He is well-developed and well-nourished.  HENT:     Head: Normocephalic and atraumatic.     Mouth/Throat:     Mouth: Oropharynx is clear and moist and mucous membranes are normal.  Eyes:     General: Lids are normal.     Extraocular Movements: EOM normal.     Conjunctiva/sclera: Conjunctivae normal.     Pupils: Pupils are equal, round, and reactive to light.  Cardiovascular:     Rate and Rhythm: Normal rate and regular rhythm.     Pulses: Normal pulses.     Heart sounds: Normal heart sounds. No murmur heard. No friction rub. No gallop.   Pulmonary:     Effort: Pulmonary effort is normal.     Breath sounds: Normal breath sounds.     Comments: Lungs clear to auscultation bilaterally.  Symmetric chest rise.  No wheezing, rales, rhonchi. Abdominal:     Palpations: Abdomen is soft. Abdomen is not rigid.     Tenderness: There is abdominal tenderness in the right lower quadrant. There is no guarding.       Comments: Abdomen soft, nondistended. Tenderness noted to the distal right lower quadrant just above the pelvis region.  Genitourinary:      Comments: The exam was performed with a chaperone present Rene Kocher, [place]). Palpable mass, approximately size of a quarter noted to be right scrotum. No overlying warmth, erythema. The area is tender. No testicular tenderness on the left side. No overlying warmth, erythema, edema. Musculoskeletal:        General: Normal range of motion.     Cervical back: Full passive range of motion without pain.  Skin:    General: Skin is warm and dry.     Capillary Refill: Capillary refill takes less than 2 seconds.  Neurological:     Mental Status: He is alert and oriented  to person, place, and time.  Psychiatric:        Mood and Affect: Mood and affect normal.        Speech: Speech normal.     ED Results / Procedures / Treatments   Labs (all labs ordered are listed, but only abnormal results are displayed) Labs Reviewed  COMPREHENSIVE METABOLIC PANEL - Abnormal; Notable for the following components:      Result Value   Glucose, Bld 110 (*)    Total Bilirubin 1.4 (*)    All other components within normal limits  CBC WITH DIFFERENTIAL/PLATELET - Abnormal; Notable for the following components:   Neutro Abs 8.2 (*)    All other components  within normal limits  URINALYSIS, ROUTINE W REFLEX MICROSCOPIC - Abnormal; Notable for the following components:   Specific Gravity, Urine >1.046 (*)    Ketones, ur 20 (*)    All other components within normal limits  SARS CORONAVIRUS 2 (TAT 6-24 HRS)    EKG None  Radiology CT ABDOMEN PELVIS W CONTRAST  Result Date: 06/11/2020 CLINICAL DATA:  Pelvic pain EXAM: CT ABDOMEN AND PELVIS WITH CONTRAST TECHNIQUE: Multidetector CT imaging of the abdomen and pelvis was performed using the standard protocol following bolus administration of intravenous contrast. CONTRAST:  OMNIPAQUE IOHEXOL 300 MG/ML  SOLN COMPARISON:  Ultrasound from earlier in the same day. FINDINGS: Lower chest: No acute abnormality. Hepatobiliary: No focal liver abnormality is seen. No gallstones, gallbladder wall thickening, or biliary dilatation. Pancreas: Unremarkable. No pancreatic ductal dilatation or surrounding inflammatory changes. Spleen: Normal in size without focal abnormality. Adrenals/Urinary Tract: Adrenal glands are within normal limits. Kidneys demonstrate a normal enhancement pattern bilaterally. No renal calculi or obstructive changes are noted. Delayed images demonstrate normal excretion of contrast. The bladder is partially distended. Stomach/Bowel: Appendix is within normal limits. No obstructive or inflammatory changes of the colon  are seen. Small bowel and stomach are within normal limits. Vascular/Lymphatic: No significant vascular findings are present. No enlarged abdominal or pelvic lymph nodes. Reproductive: Prostate is unremarkable. Other: No abdominal wall hernia or abnormality. No abdominopelvic ascites. Musculoskeletal: No acute or significant osseous findings. IMPRESSION: No acute abnormality noted. Electronically Signed   By: Alcide Clever M.D.   On: 06/11/2020 10:29   US SCROTUM W/DOPPLER  Result Date: 06/11/2020 CLINICAL DATA:  Testicular pain. EXAM: SCROTAL ULTRASOUND DOPPLER ULTRASOUND OF THE TESTICLES TECHNIQUE: Complete ultrasound examination of the testicles, epididymis, and other scrotal structures was performed. Color and spectral Doppler ultrasound were also utilized to evaluate blood flow to the testicles. COMPARISON:  None. FINDINGS: Right testicle Measurements: 4.7 x 3.7 x 3.3 cm. No mass or microlithiasis visualized. Left testicle Measurements: 5.0 x 3.4 x 2.8 cm. No mass or microlithiasis visualized. Right epididymis:  Normal in size and appearance. Left epididymis:  2 mm cyst is noted. Hydrocele:  Small right hydrocele is noted. Varicocele:  None visualized. Pulsed Doppler interrogation of both testes demonstrates normal low resistance arterial and venous waveforms bilaterally. IMPRESSION: No evidence of testicular mass or torsion. Small left epididymal cyst. Small right hydrocele. Electronically Signed   By: Lupita Raider M.D.   On: 06/11/2020 10:12    Procedures Procedures   Medications Ordered in ED Medications  ondansetron (ZOFRAN) injection 4 mg (4 mg Intravenous Not Given 06/11/20 0848)  morphine 4 MG/ML injection 4 mg (4 mg Intravenous Given 06/11/20 0846)  iohexol (OMNIPAQUE) 300 MG/ML solution 100 mL (100 mLs Intravenous Contrast Given 06/11/20 1013)    ED Course  I have reviewed the triage vital signs and the nursing notes.  Pertinent labs & imaging results that were available during my care  of the patient were reviewed by me and considered in my medical decision making (see chart for details).    MDM Rules/Calculators/A&P                          45 year old male who presents for evaluation of right lower quadrant abdominal pain, right testicular pain that has been ongoing since yesterday. History of inguinal hernias bilaterally. On initial arrival, he is afebrile, appears uncomfortable but no acute distress. Vital signs are stable. On exam, he is some tenderness into  the very lower right abdomen into the pelvis region as well as some tenderness noted to the right scrotum. Concern for hernia. History/physical exam not concerning for kidney stone, epididymitis. Doubt testicular torsion but he is tender in that area and there is some swelling.  CBC shows no leukocytosis. CMP is unremarkable.   Ultrasound of scrotum shows no evidence of testicular mass or torsion.  There is a small right hydrocele as well as a small left epididymal cyst.  CT abdomen pelvis shows no evidence of hernia, acute abnormalities.  Dr. Rikki Spearing and I eevaluated the patient.  He reports improvement in pain after pain medication.  The area that he is feeling we suspect is a hydrocele is seen on the ultrasound.  He has improvement with palpation and no tenderness.  At this time, does not represent an incarcerated hernia.  He has no overlying warmth, erythema of his testicle that would be concerning for infectious etiology. Will obtain urine.   Urine shows no leuks, infectious signs.  RN informing that patient had a slight temperature of 100.4.  Patient is otherwise stable.  He has no leukocytosis here and does not any other infectious symptoms.  CT scan shows no evidence of infectious process on his scan.  There is no concern for appendicitis on imaging.  His tenderness is more towards the inguinal area not focal over McBurney's point.  Do not suspect appendicitis.  Instructed him to monitor his fever at home and to  return if he gets worse, worsening abdominal pain, nausea/vomiting.  We will plan to get Covid test.  I suspect his pain is likely due to from the hydrocele that was seen on ultrasound.  Will give short course of pain medication for acute breakthrough pain.  Patient instructed to follow-up with urology as well. At this time, patient exhibits no emergent life-threatening condition that require further evaluation in ED. Patient had ample opportunity for questions and discussion. All patient's questions were answered with full understanding. Strict return precautions discussed. Patient expresses understanding and agreement to plan.   Portions of this note were generated with Scientist, clinical (histocompatibility and immunogenetics). Dictation errors may occur despite best attempts at proofreading.   Final Clinical Impression(s) / ED Diagnoses Final diagnoses:  Right testicular pain  Hydrocele, unspecified hydrocele type    Rx / DC Orders ED Discharge Orders         Ordered    HYDROcodone-acetaminophen (NORCO/VICODIN) 5-325 MG tablet  Every 6 hours PRN        06/11/20 1246           Rosana Hoes 06/11/20 1534    Jacalyn Lefevre, MD 06/11/20 [phone]

## 2020-06-11 NOTE — Discharge Instructions (Signed)
You can take Tylenol or Ibuprofen as directed for pain. You can alternate Tylenol and Ibuprofen every 4 hours. If you take Tylenol at 1pm, then you can take Ibuprofen at 5pm. Then you can take Tylenol again at 9pm.   Take pain medications as directed for break through pain. Do not drive or operate machinery while taking this medication.   As we discussed, your imaging looked reassuring today.  You do have a small hydrocele noted on the right which is likely contributing to your pain.  No signs of infection.  Closely monitor your symptoms.  Return if you have any persistent fever, worsening abdominal pain, nausea/vomiting or any other worsening concerning symptoms.

## 2020-06-11 NOTE — ED Notes (Signed)
Patient transported to CT 

## 2020-06-11 NOTE — ED Triage Notes (Signed)
Patient c/o right scrotal pain since yesterday. Patient states he does not notice swelling, but does have tightness and pressure. Patient states he has been having issues with having a BM since yesterday.

## 2020-06-12 LAB — SARS CORONAVIRUS 2 (TAT 6-24 HRS): SARS Coronavirus 2: NEGATIVE

## 2020-11-21 ENCOUNTER — Other Ambulatory Visit: Payer: Self-pay

## 2020-11-21 ENCOUNTER — Ambulatory Visit (INDEPENDENT_AMBULATORY_CARE_PROVIDER_SITE_OTHER): Payer: Commercial Managed Care - PPO | Admitting: Emergency Medicine

## 2020-11-21 ENCOUNTER — Encounter: Payer: Self-pay | Admitting: Emergency Medicine

## 2020-11-21 VITALS — BP 116/66 | HR 60 | Temp 98.2°F | Resp 18 | Ht 72.0 in | Wt 156.0 lb

## 2020-11-21 DIAGNOSIS — M778 Other enthesopathies, not elsewhere classified: Secondary | ICD-10-CM

## 2020-11-21 MED ORDER — DICLOFENAC SODIUM 75 MG PO TBEC: 75.0000 mg | DELAYED_RELEASE_TABLET | Freq: Two times a day (BID) | ORAL | 1 refills | Status: AC | PRN

## 2020-11-21 NOTE — Patient Instructions (Signed)
Tendinitis  Tendinitis is swelling (inflammation) of a tendon. A tendon is a cord of tissue that connects muscle to bone.Tendinitis can cause pain, tenderness, and swelling. What are the causes? Using a tendon or muscle too much (overuse). This is a common cause. Wear and tear that happens as you age. Injury. Some medical conditions, such as arthritis. Some medicines. What increases the risk? You are more likely to get this condition if you do activities that involve the same movements over and over again (repetitive motions). What are the signs or symptoms? Pain. Tenderness. Mild swelling. Decreased range of motion. How is this treated? This condition is usually treated with RICE therapy. RICE stands for: Rest. Ice. Compression. This means putting pressure on the affected area. Elevation. This means raising the affected area above the level of your heart. Treatment may also include: Medicines for swelling or pain. Exercises or physical therapy. A brace or splint. Surgery. This is rarely needed. Follow these instructions at home: If you have a splint or brace: Wear the splint or brace as told by your doctor. Remove it only as told by your doctor. Loosen the splint or brace if your fingers or toes: Tingle. Become numb. Turn cold and blue. Keep the splint or brace clean. If the splint or brace is not waterproof: Do not let it get wet. Cover it with a watertight covering when you take a bath or shower. Managing pain, stiffness, and swelling     If told, put ice on the affected area. If you have a removable splint or brace, remove it as told by your doctor. Put ice in a plastic bag. Place a towel between your skin and the bag. Leave the ice on for 20 minutes, 2-3 times a day. Move the fingers or toes of the affected arm or leg often, if this applies. This helps to prevent stiffness and to lessen swelling. If told, raise the affected area above the level of your heart while  you are sitting or lying down. If told, put heat on the affected area before you exercise. Use the heat source that your doctor recommends, such as a moist heat pack or a heating pad. Place a towel between your skin and the heat source. Leave the heat on for 20-30 minutes. Remove the heat if your skin turns bright red. This is very important if you are unable to feel pain, heat, or cold. You may have a greater risk of getting burned. Driving Do not drive or use heavy machinery while taking prescription pain medicine. Ask your doctor when it is safe to drive if you have a splint or brace on any part of your arm or leg. Activity Rest the affected area as told by your doctor. Return to your normal activities as told by your doctor. Ask your doctor what activities are safe for you. Avoid using the affected area while you have symptoms. Do exercises as told by your doctor. General instructions If you have a splint, do not put pressure on any part of the splint until it is fully hardened. This may take several hours. Wear an elastic bandage or pressure (compression) wrap only as told by your doctor. Take over-the-counter and prescription medicines only as told by your doctor. Keep all follow-up visits as told by your doctor. This is important. Contact a doctor if: You do not get better. You get new problems, such as numbness in your hands, and you do not know why. Summary Tendinitis is swelling (inflammation) of  You are more likely to get this condition if you do activities that involve the same movements over and over again. This condition is usually treated with RICE therapy. RICE stands for rest, ice, compression, and elevate. Avoid using the affected area while you have symptoms. This information is not intended to replace advice given to you by your health care provider. Make sure you discuss any questions you have with your health care provider. Document Revised: 10/27/2017 Document  Reviewed: 09/09/2017 Elsevier Patient Education  2022 [employer]  

## 2020-11-21 NOTE — Progress Notes (Signed)
Derek [name] 45 y.o.   Chief Complaint  Patient presents with   Elbow Concerns    Started in Feb. Concerns for tennis elbow. He believes its work related. Both elbows, right is worse than left.     HISTORY OF PRESENT ILLNESS: This is a 45 y.o. male concerned about possible tennis elbow, complaining of pain to both elbow areas for the past several months probably related to work activities.  Pain waxes and wanes, responds to ibuprofen.  No other injuries or associated symptoms. More pain to right elbow than left. No other complaints or medical concerns today.  HPI   Prior to Admission medications   Medication Sig Start Date End Date Taking? Authorizing Provider  ibuprofen (ADVIL) 200 MG tablet Take 400 mg by mouth every 6 (six) hours as needed for fever, headache or mild pain.   Yes [provider]  Multiple Vitamin (MULTIVITAMIN WITH MINERALS) TABS tablet Take 1 tablet by mouth daily.   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   ALLERGIC RHINITIS CAUSE UNSPECIFIED 06/01/2009    Past Medical History:  Diagnosis Date   ALLERGIC RHINITIS CAUSE UNSPECIFIED 06/01/2009   Complication of anesthesia    family history- taken longer to go asleep   No pertinent past medical history     Past Surgical History:  Procedure Laterality Date   HERNIA REPAIR     INGUINAL HERNIA REPAIR  06/07/2012   Procedure: LAPAROSCOPIC BILATERAL INGUINAL HERNIA REPAIR;  Surgeon: Shelly Rubenstein, MD;  Location: [facility];  Service: General;   Laterality: Bilateral;   INSERTION OF MESH  06/07/2012   Procedure: INSERTION OF MESH;  Surgeon: Shelly Rubenstein, MD;  Location: [facility];  Service: General;  Laterality: Bilateral;   wisdom teeth removal      Social History   Socioeconomic History   Marital status: Married    Spouse name: Not on file   Number of children: Not on file   Years of education: Not on file   Highest education level: Not on file  Occupational History   Occupation: sales at [employer]  Tobacco Use   Smoking status: Never   Smokeless tobacco: Never  Vaping Use   Vaping Use: Never used  Substance and Sexual Activity   Alcohol use: Not Currently    Comment: 1 beer a day   Drug use: No   Sexual activity: Yes    Birth control/protection: None  Other Topics Concern   Not on file  Social History Narrative   Not on file   Social Determinants of Health   Financial Resource Strain: Not on file  Food Insecurity: Not on file  Transportation Needs: Not on file  Physical Activity: Not on file  Stress: Not on file  Social Connections: Not on file  Intimate Partner Violence: Not on file    Family History  Problem Relation Age of Onset   Hypertension Mother    Hypertension Father    Arthritis Other    Hypertension Other    Hyperlipidemia Other  Review of Systems  Constitutional: Negative.  Negative for chills and fever.  HENT: Negative.  Negative for congestion and sore throat.   Respiratory: Negative.  Negative for cough and shortness of breath.   Cardiovascular:  Negative for chest pain and palpitations.  Gastrointestinal:  Negative for abdominal pain, nausea and vomiting.  Skin: Negative.  Negative for rash.  Neurological:  Negative for dizziness and headaches.  All other systems reviewed and are negative.   Physical Exam Vitals reviewed.  Constitutional:      Appearance: Normal appearance.  HENT:     Head: Normocephalic.  Eyes:     Extraocular Movements: Extraocular movements intact.      Pupils: Pupils are equal, round, and reactive to light.  Cardiovascular:     Rate and Rhythm: Normal rate.  Pulmonary:     Effort: Pulmonary effort is normal.  Musculoskeletal:     Cervical back: Normal range of motion and neck supple.     Comments: Right elbow: Mild tendon tenderness.  No tenderness of epicondyles Left elbow: Mild tendon tenderness.  No tenderness over epicondyles Full range of motion of both elbows No other significant findings.  Skin:    General: Skin is warm and dry.     Capillary Refill: Capillary refill takes less than 2 seconds.  Neurological:     General: No focal deficit present.     Mental Status: He is alert and oriented to person, place, and time.  Psychiatric:        Mood and Affect: Mood normal.        Behavior: Behavior normal.     ASSESSMENT & PLAN: Derek [name] was seen today for elbow concerns.  Diagnoses and all orders for this visit:  Tendinitis of both elbows -     diclofenac (VOLTAREN) 75 MG EC tablet; Take 1 tablet (75 mg total) by mouth 2 (two) times daily as needed.  Patient Instructions  Tendinitis  Tendinitis is swelling (inflammation) of a tendon. A tendon is a cord of tissue that connects muscle to bone.Tendinitis can cause pain, tenderness, and swelling. What are the causes? Using a tendon or muscle too much (overuse). This is a common cause. Wear and tear that happens as you age. Injury. Some medical conditions, such as arthritis. Some medicines. What increases the risk? You are more likely to get this condition if you do activities that involve the same movements over and over again (repetitive motions). What are the signs or symptoms? Pain. Tenderness. Mild swelling. Decreased range of motion. How is this treated? This condition is usually treated with RICE therapy. RICE stands for: Rest. Ice. Compression. This means putting pressure on the affected area. Elevation. This means raising the affected area above the  level of your heart. Treatment may also include: Medicines for swelling or pain. Exercises or physical therapy. A brace or splint. Surgery. This is rarely needed. Follow these instructions at home: If you have a splint or brace: Wear the splint or brace as told by your doctor. Remove it only as told by your doctor. Loosen the splint or brace if your fingers or toes: Tingle. Become numb. Turn cold and blue. Keep the splint or brace clean. If the splint or brace is not waterproof: Do not let it get wet. Cover it with a watertight covering when you take a bath or shower. Managing pain, stiffness, and swelling     If told, put ice on the affected area. If you have a removable splint or brace, remove  it as told by your doctor. Put ice in a plastic bag. Place a towel between your skin and the bag. Leave the ice on for 20 minutes, 2-3 times a day. Move the fingers or toes of the affected arm or leg often, if this applies. This helps to prevent stiffness and to lessen swelling. If told, raise the affected area above the level of your heart while you are sitting or lying down. If told, put heat on the affected area before you exercise. Use the heat source that your doctor recommends, such as a moist heat pack or a heating pad. Place a towel between your skin and the heat source. Leave the heat on for 20-30 minutes. Remove the heat if your skin turns bright red. This is very important if you are unable to feel pain, heat, or cold. You may have a greater risk of getting burned. Driving Do not drive or use heavy machinery while taking prescription pain medicine. Ask your doctor when it is safe to drive if you have a splint or brace on any part of your arm or leg. Activity Rest the affected area as told by your doctor. Return to your normal activities as told by your doctor. Ask your doctor what activities are safe for you. Avoid using the affected area while you have symptoms. Do exercises  as told by your doctor. General instructions If you have a splint, do not put pressure on any part of the splint until it is fully hardened. This may take several hours. Wear an elastic bandage or pressure (compression) wrap only as told by your doctor. Take over-the-counter and prescription medicines only as told by your doctor. Keep all follow-up visits as told by your doctor. This is important. Contact a doctor if: You do not get better. You get new problems, such as numbness in your hands, and you do not know why. Summary Tendinitis is swelling (inflammation) of a tendon. You are more likely to get this condition if you do activities that involve the same movements over and over again. This condition is usually treated with RICE therapy. RICE stands for rest, ice, compression, and elevate. Avoid using the affected area while you have symptoms. This information is not intended to replace advice given to you by your health care provider. Make sure you discuss any questions you have with your healthcare provider. Document Revised: 10/27/2017 Document Reviewed: 09/09/2017 Elsevier Patient Education  2022 [employer]    Edwina Barth, MD [facility]

## 2020-12-19 ENCOUNTER — Ambulatory Visit: Payer: Commercial Managed Care - PPO | Admitting: Internal Medicine

## 2022-08-28 IMAGING — CT CT ABD-PELV W/ CM
2 of 5 series · 17 of 46 positions shown, 19 images · IV contrast (OMNIPAQUE 300)
Comparison: Ultrasound from earlier in the same day.

CLINICAL DATA: Pelvic pain

EXAM:
CT ABDOMEN AND PELVIS WITH CONTRAST
TECHNIQUE: Multidetector CT imaging of the abdomen and pelvis was performed
using the standard protocol following bolus administration of
intravenous contrast.
CONTRAST:  100mL OMNIPAQUE IOHEXOL 300 MG/ML  SOLN

[Series 2: axial st · axial · 0.75mm/px · z∈[-540,-110]mm · 14 of 100 slices shown, 16 images]
[im 7/100  soft-tissue]
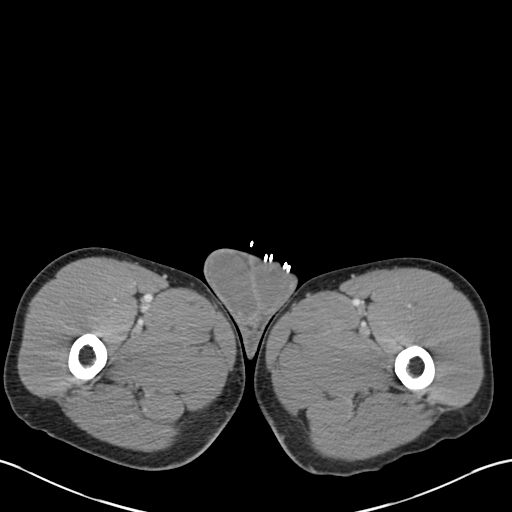
[im 7/100  bone]
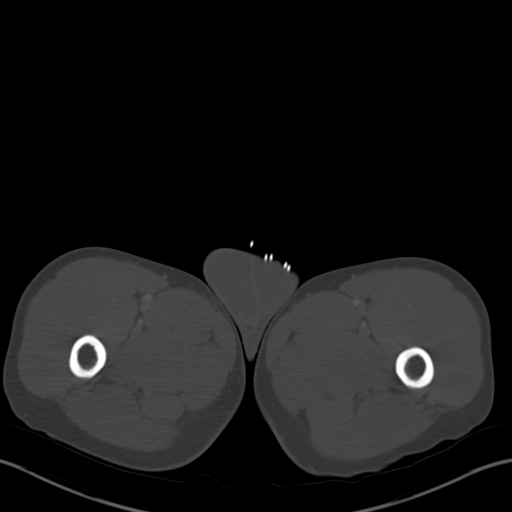
[im 13/100  soft-tissue]
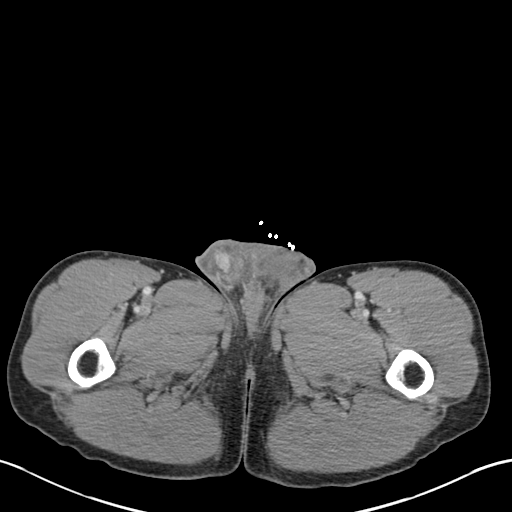
[im 19/100  soft-tissue]
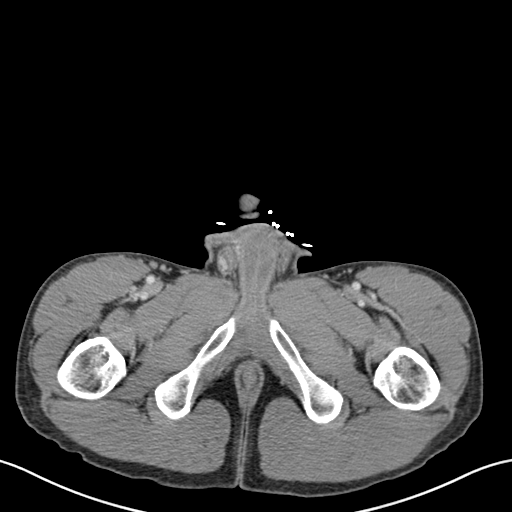
[im 25/100  soft-tissue]
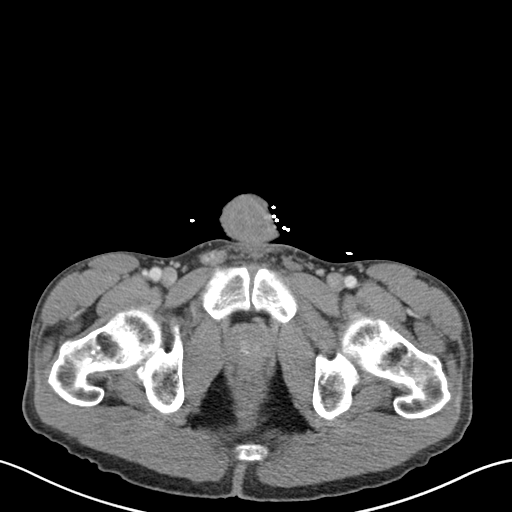
[im 31/100  soft-tissue]
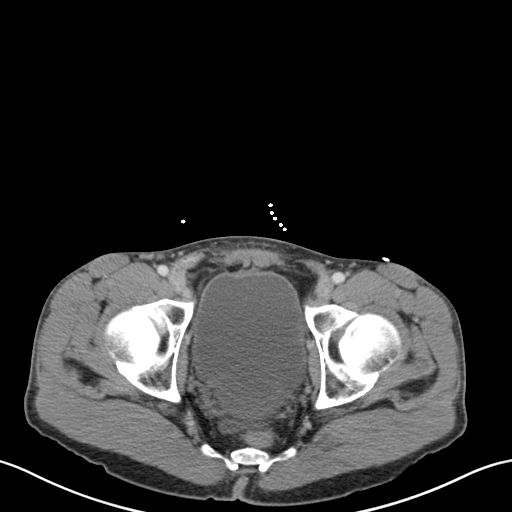
[im 38/100  soft-tissue]
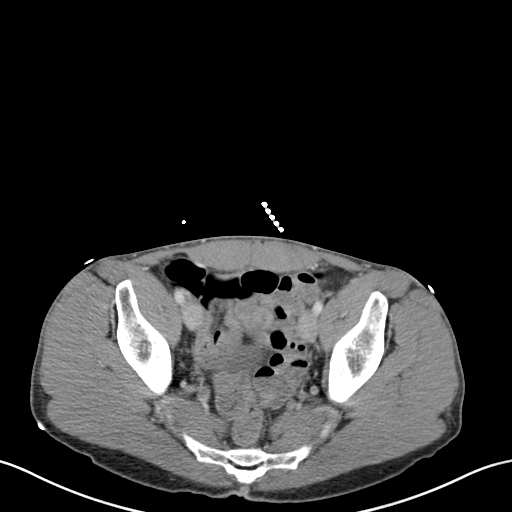
[im 44/100  soft-tissue]
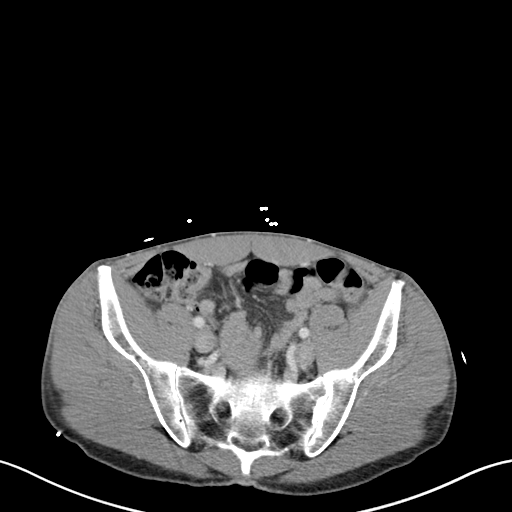
[im 56/100  soft-tissue]
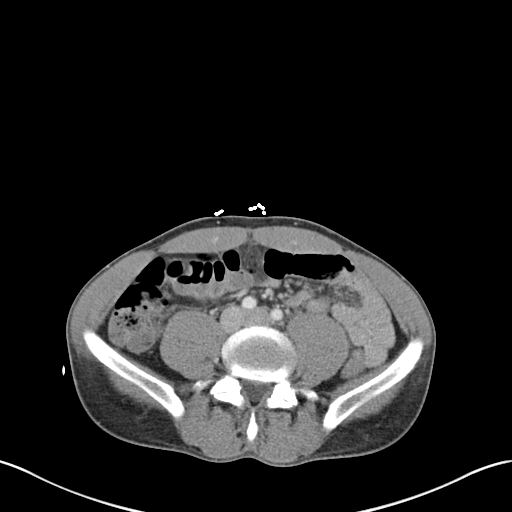
[im 62/100  soft-tissue]
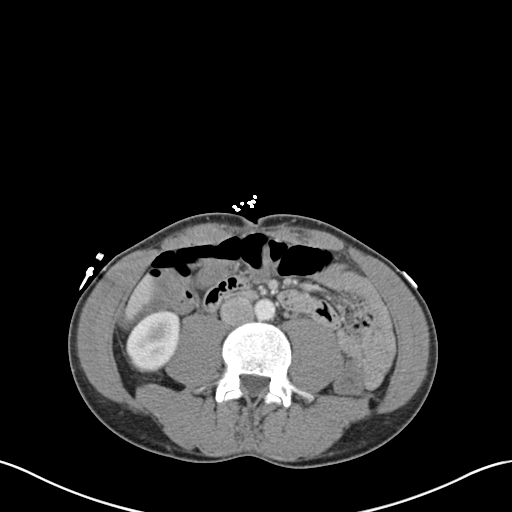
[im 62/100  bone]
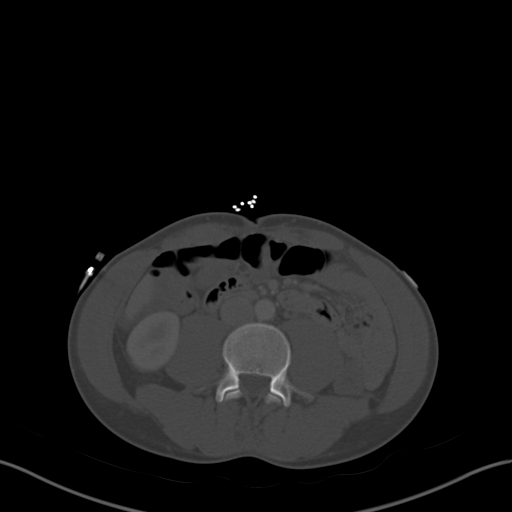
[im 69/100  soft-tissue]
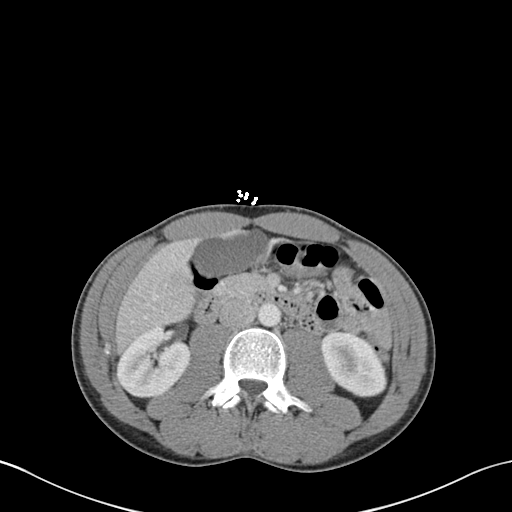
[im 75/100  soft-tissue]
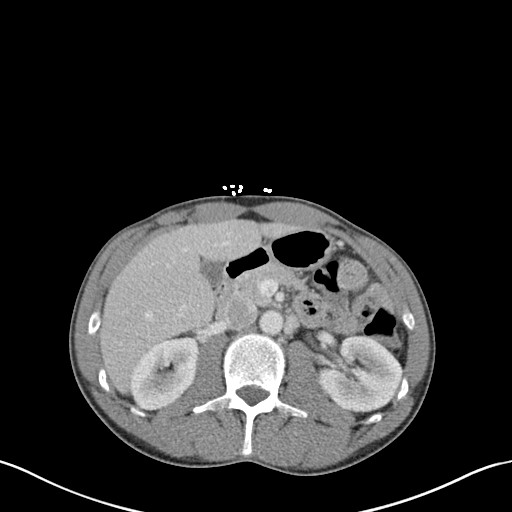
[im 81/100  soft-tissue]
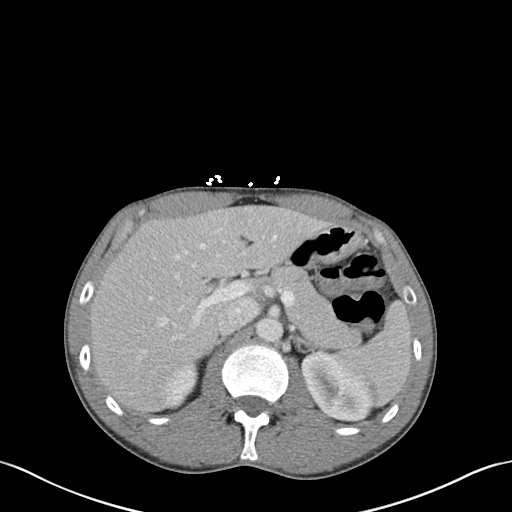
[im 87/100  soft-tissue]
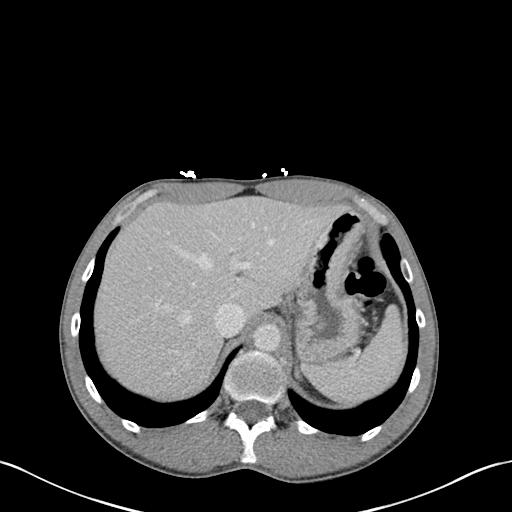
[im 93/100  soft-tissue]
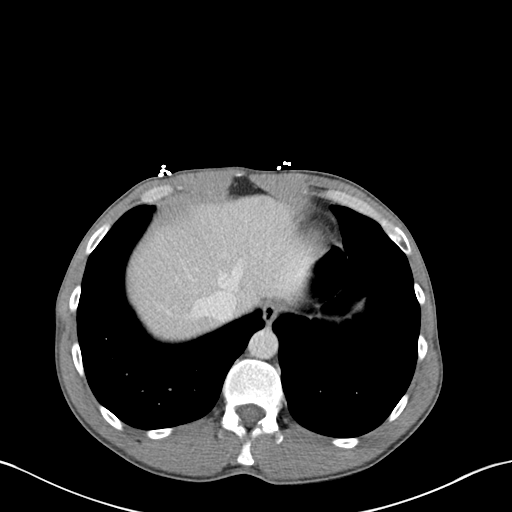

[Series 4: coronal st · coronal · 0.95mm/px · 3 of 124 slices shown]
[im 42/124  soft-tissue]
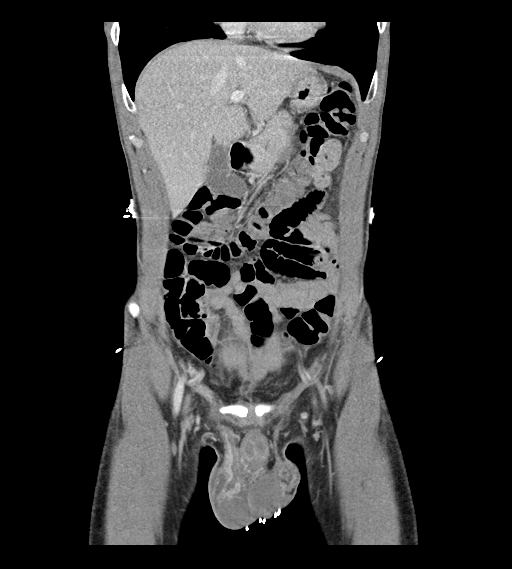
[im 55/124  soft-tissue]
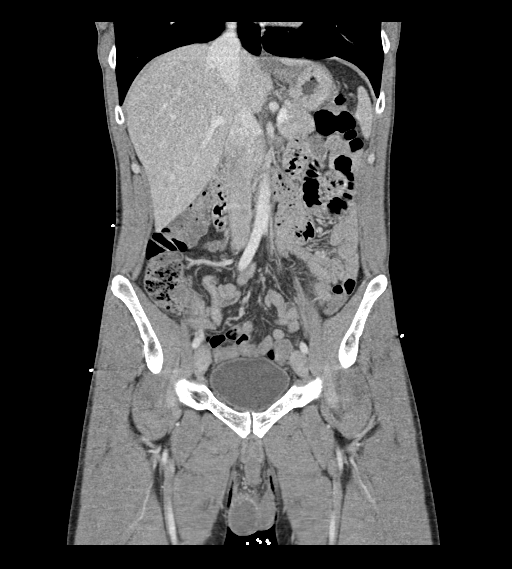
[im 69/124  soft-tissue]
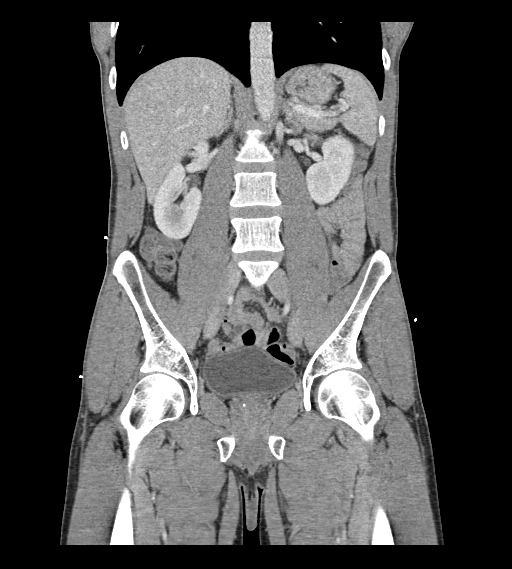

[17 of 46 positions shown; findings below may reference images not displayed]

FINDINGS: Lower chest: No acute abnormality.

Hepatobiliary: No focal liver abnormality is seen. No gallstones,
gallbladder wall thickening, or biliary dilatation.

Pancreas: Unremarkable. No pancreatic ductal dilatation or
surrounding inflammatory changes.

Spleen: Normal in size without focal abnormality.

Adrenals/Urinary Tract: Adrenal glands are within normal limits.
Kidneys demonstrate a normal enhancement pattern bilaterally. No
renal calculi or obstructive changes are noted. Delayed images
demonstrate normal excretion of contrast. The bladder is partially
distended.

Stomach/Bowel: Appendix is within normal limits. No obstructive or
inflammatory changes of the colon are seen. Small bowel and stomach
are within normal limits.

Vascular/Lymphatic: No significant vascular findings are present. No
enlarged abdominal or pelvic lymph nodes.

Reproductive: Prostate is unremarkable.

Other: No abdominal wall hernia or abnormality. No abdominopelvic
ascites.

Musculoskeletal: No acute or significant osseous findings.
IMPRESSION: No acute abnormality noted.

## 2022-08-28 IMAGING — US US SCROTUM W/ DOPPLER COMPLETE
2 series · 14 of 25 positions shown · non-contrast
Comparison: None.

CLINICAL DATA: Testicular pain.

EXAM:
SCROTAL ULTRASOUND
DOPPLER ULTRASOUND OF THE TESTICLES
TECHNIQUE: Complete ultrasound examination of the testicles, epididymis, and
other scrotal structures was performed. Color and spectral Doppler
ultrasound were also utilized to evaluate blood flow to the
testicles.

[Series 1: us scrotum w/ doppler complete · 13 of 77 slices shown (1 of 2)]
[im 1/77]
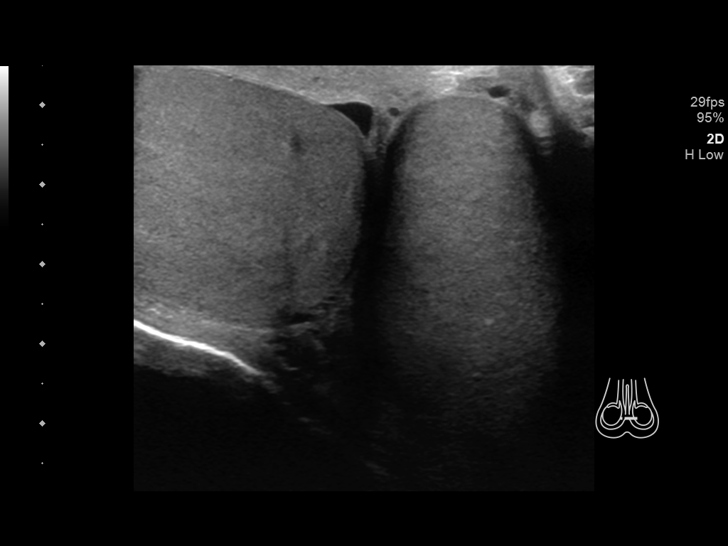
[im 7/77]
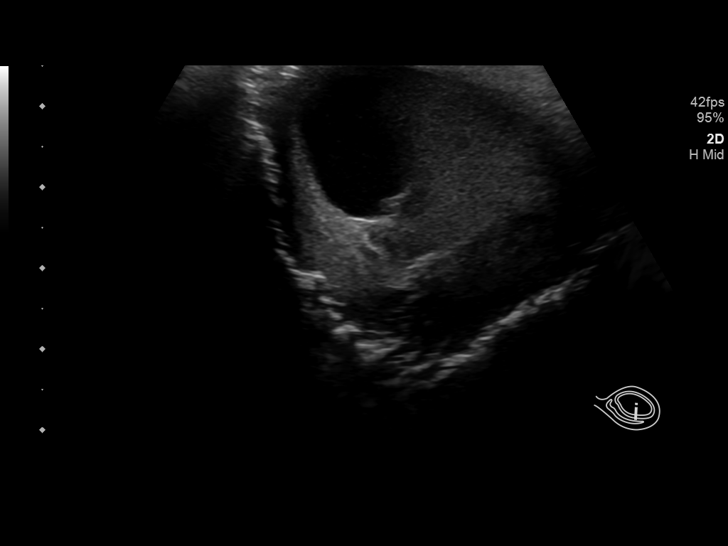
[im 14/77]
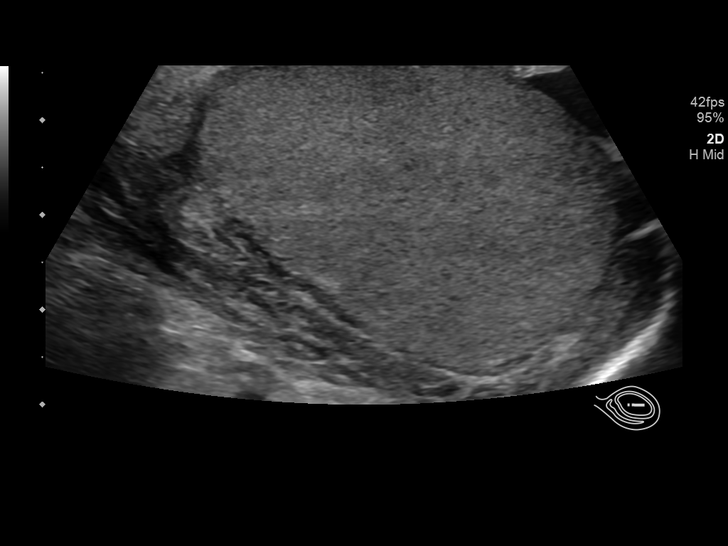
[im 20/77]
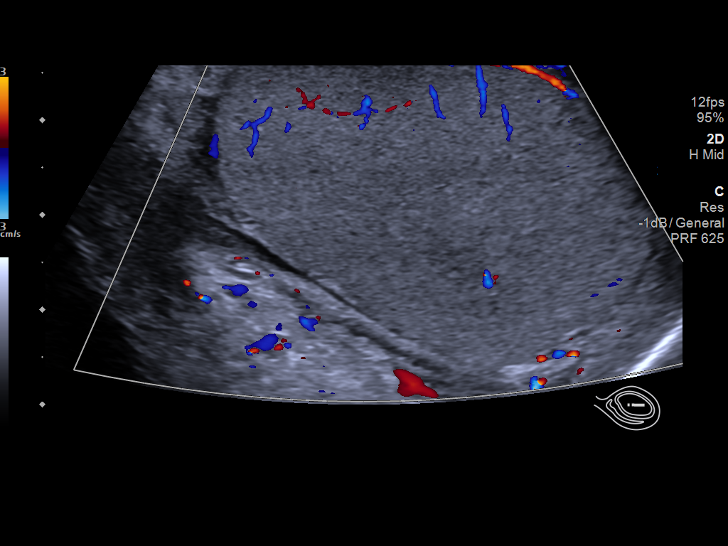
[im 27/77]
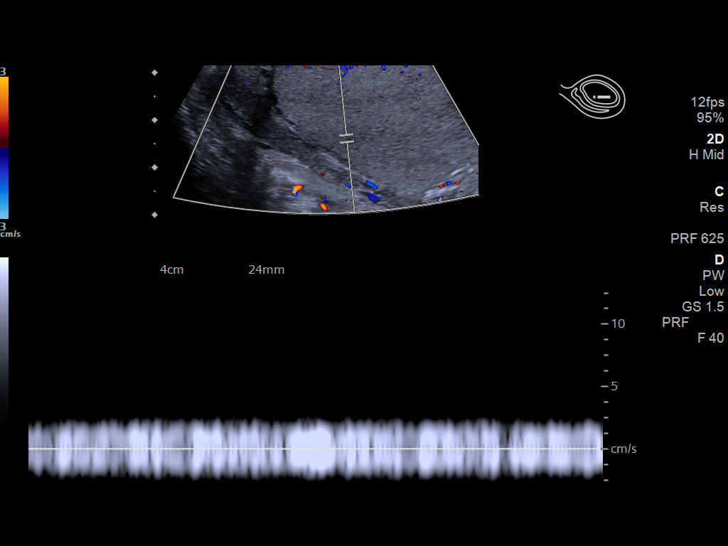
[im 30/77]
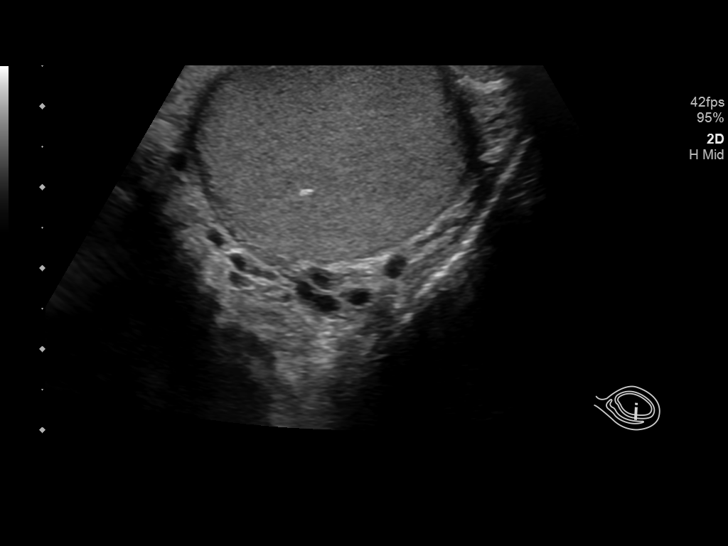
[im 37/77]
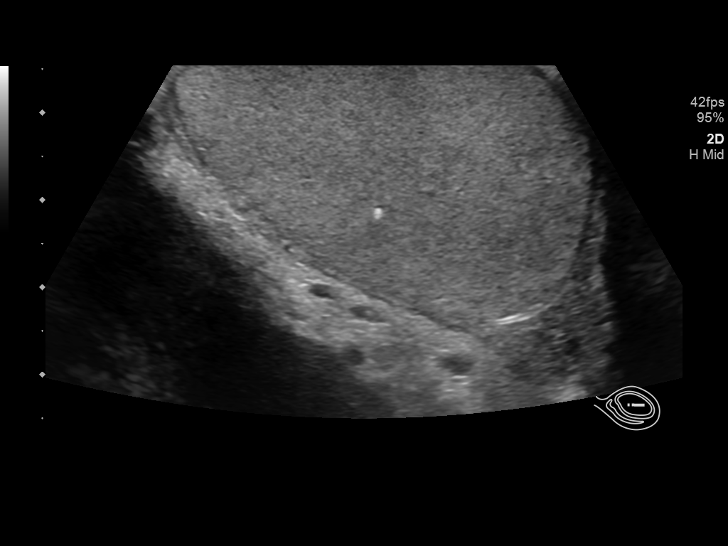
[im 43/77]
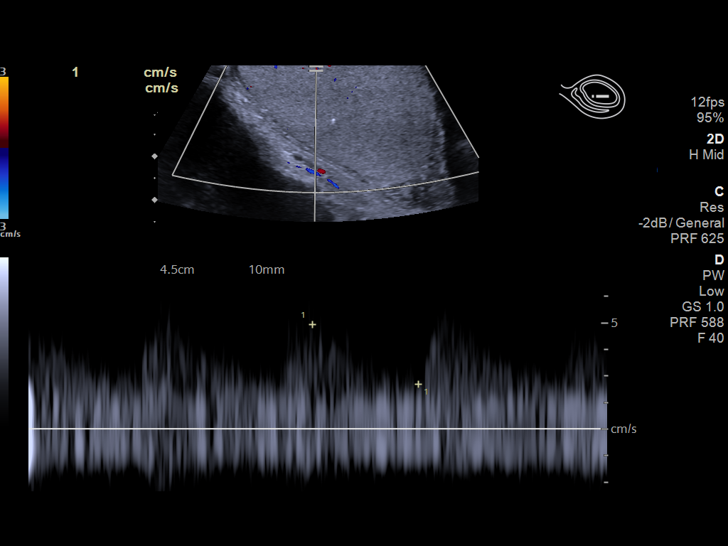
[im 50/77]
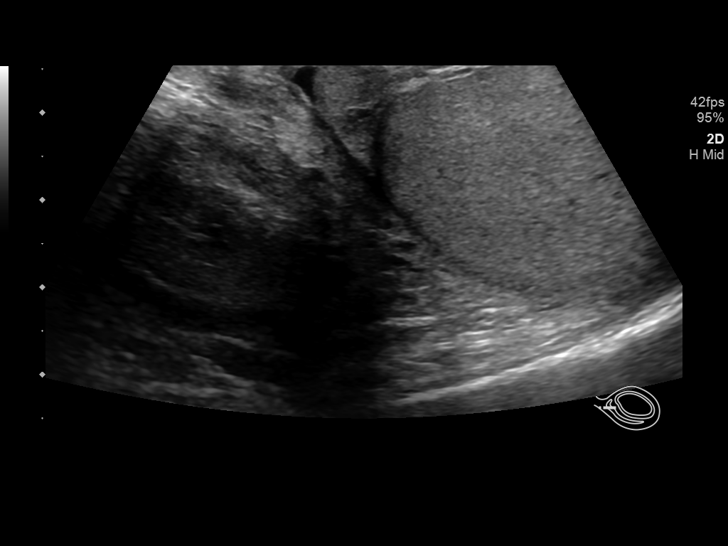
[im 53/77]
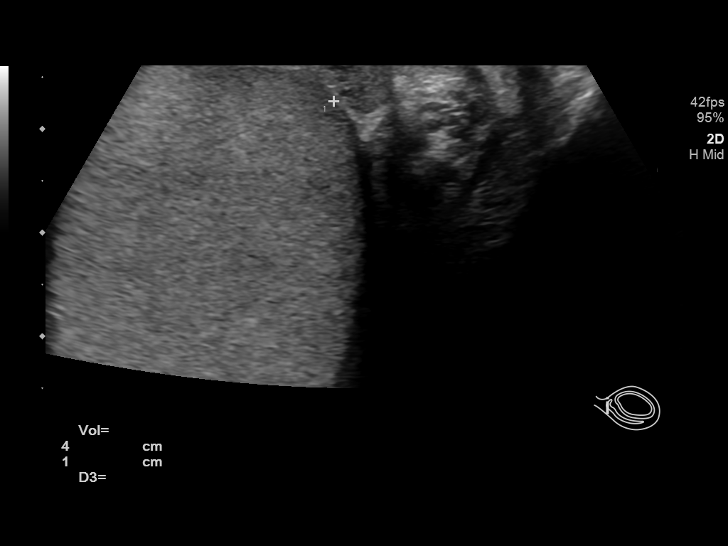
[im 60/77]
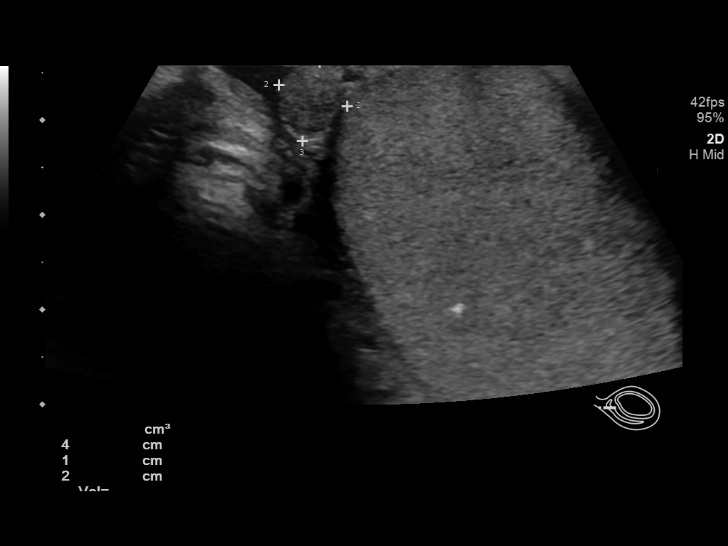
[im 67/77]
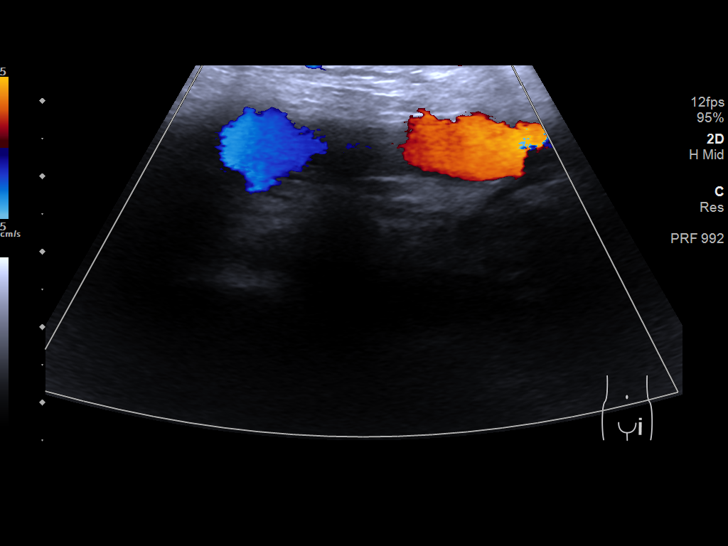
[im 73/77]
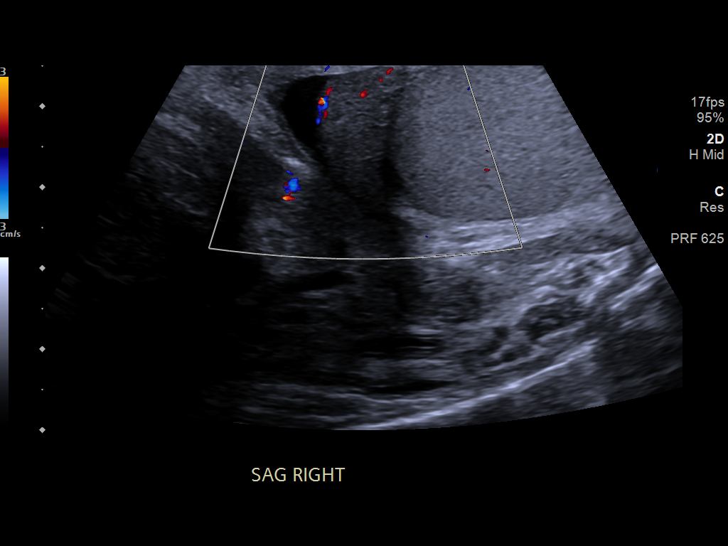

[Series 2: us scrotum w/ doppler complete · 1 of 2 slices shown (2 of 2)]
[im 1/2]
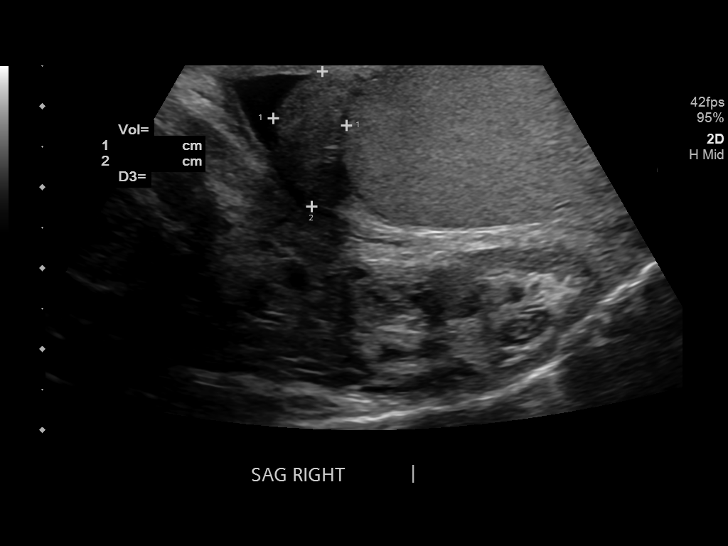

[14 of 25 positions shown; findings below may reference images not displayed]

FINDINGS: Right testicle

Measurements: 4.7 x 3.7 x 3.3 cm. No mass or microlithiasis
visualized.

Left testicle

Measurements: 5.0 x 3.4 x 2.8 cm. No mass or microlithiasis
visualized.

Right epididymis:  Normal in size and appearance.

Left epididymis:  2 mm cyst is noted.

Hydrocele:  Small right hydrocele is noted.

Varicocele:  None visualized.

Pulsed Doppler interrogation of both testes demonstrates normal low
resistance arterial and venous waveforms bilaterally.
IMPRESSION: No evidence of testicular mass or torsion. Small left epididymal
cyst. Small right hydrocele.
